# Patient Record
Sex: Male | Born: 1950 | Race: White | Hispanic: No
Health system: Southern US, Community
[De-identification: ages and names within clinical notes are randomized; demographics above are authoritative.]

---

## 2015-06-25 ENCOUNTER — Inpatient Hospital Stay (HOSPITAL_COMMUNITY): Payer: Commercial Managed Care - PPO

## 2015-06-25 ENCOUNTER — Inpatient Hospital Stay (HOSPITAL_COMMUNITY)
Admission: EM | Admit: 2015-06-25 | Discharge: 2015-07-27 | DRG: 208 | Disposition: E | Payer: Commercial Managed Care - PPO | Attending: Internal Medicine | Admitting: Internal Medicine

## 2015-06-25 ENCOUNTER — Encounter (HOSPITAL_COMMUNITY): Payer: Self-pay | Admitting: Emergency Medicine

## 2015-06-25 ENCOUNTER — Emergency Department (HOSPITAL_COMMUNITY): Payer: Commercial Managed Care - PPO

## 2015-06-25 DIAGNOSIS — I468 Cardiac arrest due to other underlying condition: Secondary | ICD-10-CM | POA: Diagnosis present

## 2015-06-25 DIAGNOSIS — R739 Hyperglycemia, unspecified: Secondary | ICD-10-CM | POA: Diagnosis present

## 2015-06-25 DIAGNOSIS — G931 Anoxic brain damage, not elsewhere classified: Secondary | ICD-10-CM | POA: Diagnosis present

## 2015-06-25 DIAGNOSIS — J690 Pneumonitis due to inhalation of food and vomit: Secondary | ICD-10-CM | POA: Diagnosis present

## 2015-06-25 DIAGNOSIS — I2699 Other pulmonary embolism without acute cor pulmonale: Principal | ICD-10-CM | POA: Diagnosis present

## 2015-06-25 DIAGNOSIS — R402212 Coma scale, best verbal response, none, at arrival to emergency department: Secondary | ICD-10-CM | POA: Diagnosis present

## 2015-06-25 DIAGNOSIS — R4020 Unspecified coma: Secondary | ICD-10-CM | POA: Diagnosis present

## 2015-06-25 DIAGNOSIS — E669 Obesity, unspecified: Secondary | ICD-10-CM | POA: Diagnosis present

## 2015-06-25 DIAGNOSIS — I469 Cardiac arrest, cause unspecified: Secondary | ICD-10-CM | POA: Diagnosis present

## 2015-06-25 DIAGNOSIS — Z515 Encounter for palliative care: Secondary | ICD-10-CM | POA: Insufficient documentation

## 2015-06-25 DIAGNOSIS — D751 Secondary polycythemia: Secondary | ICD-10-CM | POA: Diagnosis present

## 2015-06-25 DIAGNOSIS — N170 Acute kidney failure with tubular necrosis: Secondary | ICD-10-CM | POA: Diagnosis present

## 2015-06-25 DIAGNOSIS — I442 Atrioventricular block, complete: Secondary | ICD-10-CM | POA: Diagnosis present

## 2015-06-25 DIAGNOSIS — R001 Bradycardia, unspecified: Secondary | ICD-10-CM | POA: Diagnosis present

## 2015-06-25 DIAGNOSIS — I248 Other forms of acute ischemic heart disease: Secondary | ICD-10-CM | POA: Diagnosis present

## 2015-06-25 DIAGNOSIS — N289 Disorder of kidney and ureter, unspecified: Secondary | ICD-10-CM

## 2015-06-25 DIAGNOSIS — R092 Respiratory arrest: Secondary | ICD-10-CM | POA: Diagnosis present

## 2015-06-25 DIAGNOSIS — R402112 Coma scale, eyes open, never, at arrival to emergency department: Secondary | ICD-10-CM | POA: Diagnosis present

## 2015-06-25 DIAGNOSIS — R402312 Coma scale, best motor response, none, at arrival to emergency department: Secondary | ICD-10-CM | POA: Diagnosis present

## 2015-06-25 DIAGNOSIS — Z66 Do not resuscitate: Secondary | ICD-10-CM | POA: Diagnosis present

## 2015-06-25 DIAGNOSIS — J9621 Acute and chronic respiratory failure with hypoxia: Secondary | ICD-10-CM | POA: Diagnosis not present

## 2015-06-25 DIAGNOSIS — Z6831 Body mass index (BMI) 31.0-31.9, adult: Secondary | ICD-10-CM

## 2015-06-25 DIAGNOSIS — G40901 Epilepsy, unspecified, not intractable, with status epilepticus: Secondary | ICD-10-CM | POA: Diagnosis present

## 2015-06-25 DIAGNOSIS — E872 Acidosis: Secondary | ICD-10-CM | POA: Diagnosis present

## 2015-06-25 DIAGNOSIS — I4510 Unspecified right bundle-branch block: Secondary | ICD-10-CM | POA: Diagnosis present

## 2015-06-25 DIAGNOSIS — R579 Shock, unspecified: Secondary | ICD-10-CM | POA: Diagnosis present

## 2015-06-25 DIAGNOSIS — K7200 Acute and subacute hepatic failure without coma: Secondary | ICD-10-CM | POA: Diagnosis present

## 2015-06-25 DIAGNOSIS — R569 Unspecified convulsions: Secondary | ICD-10-CM | POA: Diagnosis not present

## 2015-06-25 DIAGNOSIS — J9601 Acute respiratory failure with hypoxia: Secondary | ICD-10-CM | POA: Diagnosis present

## 2015-06-25 DIAGNOSIS — Z452 Encounter for adjustment and management of vascular access device: Secondary | ICD-10-CM | POA: Insufficient documentation

## 2015-06-25 LAB — I-STAT CHEM 8, ED
BUN: 39 mg/dL — ABNORMAL HIGH (ref 6–20)
Calcium, Ion: 1.15 mmol/L (ref 1.13–1.30)
Chloride: 93 mmol/L — ABNORMAL LOW (ref 101–111)
Creatinine, Ser: 2.7 mg/dL — ABNORMAL HIGH (ref 0.61–1.24)
Glucose, Bld: 214 mg/dL — ABNORMAL HIGH (ref 65–99)
HEMATOCRIT: 55 % — AB (ref 39.0–52.0)
HEMOGLOBIN: 18.7 g/dL — AB (ref 13.0–17.0)
POTASSIUM: 4.9 mmol/L (ref 3.5–5.1)
SODIUM: 132 mmol/L — AB (ref 135–145)
TCO2: 28 mmol/L (ref 0–100)

## 2015-06-25 LAB — APTT
APTT: 32 s (ref 24–37)
APTT: 28 s (ref 24–37)

## 2015-06-25 LAB — COMPREHENSIVE METABOLIC PANEL
ALK PHOS: 120 U/L (ref 38–126)
ALT: 316 U/L — AB (ref 17–63)
ANION GAP: 11 (ref 5–15)
AST: 714 U/L — ABNORMAL HIGH (ref 15–41)
Albumin: 3.1 g/dL — ABNORMAL LOW (ref 3.5–5.0)
BUN: 29 mg/dL — ABNORMAL HIGH (ref 6–20)
CALCIUM: 8.9 mg/dL (ref 8.9–10.3)
CO2: 27 mmol/L (ref 22–32)
Chloride: 93 mmol/L — ABNORMAL LOW (ref 101–111)
Creatinine, Ser: 2.96 mg/dL — ABNORMAL HIGH (ref 0.61–1.24)
GFR, EST AFRICAN AMERICAN: 24 mL/min — AB (ref >60)
GFR, EST NON AFRICAN AMERICAN: 21 mL/min — AB (ref >60)
Glucose, Bld: 213 mg/dL — ABNORMAL HIGH (ref 65–99)
Potassium: 4.8 mmol/L (ref 3.5–5.1)
Sodium: 131 mmol/L — ABNORMAL LOW (ref 135–145)
TOTAL PROTEIN: 6.8 g/dL (ref 6.5–8.1)
Total Bilirubin: 1.2 mg/dL (ref 0.3–1.2)

## 2015-06-25 LAB — POCT I-STAT 3, ART BLOOD GAS (G3+)
ACID-BASE DEFICIT: 2 mmol/L (ref 0.0–2.0)
ACID-BASE EXCESS: 5 mmol/L — AB (ref 0.0–2.0)
BICARBONATE: 33.9 meq/L — AB (ref 20.0–24.0)
Bicarbonate: 23.3 mEq/L (ref 20.0–24.0)
O2 SAT: 95 %
O2 SAT: 99 %
PH ART: 7.369 (ref 7.350–7.450)
PO2 ART: 183 mmHg — AB (ref 80.0–100.0)
PO2 ART: 79 mmHg — AB (ref 80.0–100.0)
Patient temperature: 98.6
TCO2: 25 mmol/L (ref 0–100)
TCO2: 36 mmol/L (ref 0–100)
pCO2 arterial: 40.4 mmHg (ref 35.0–45.0)
pCO2 arterial: 69.6 mmHg (ref 35.0–45.0)
pH, Arterial: 7.296 — ABNORMAL LOW (ref 7.350–7.450)

## 2015-06-25 LAB — I-STAT ARTERIAL BLOOD GAS, ED
ACID-BASE DEFICIT: 1 mmol/L (ref 0.0–2.0)
BICARBONATE: 27.7 meq/L — AB (ref 20.0–24.0)
O2 SAT: 99 %
PO2 ART: 155 mmHg — AB (ref 80.0–100.0)
Patient temperature: 98.6
TCO2: 30 mmol/L (ref 0–100)
pCO2 arterial: 61.6 mmHg (ref 35.0–45.0)
pH, Arterial: 7.261 — ABNORMAL LOW (ref 7.350–7.450)

## 2015-06-25 LAB — URINALYSIS, ROUTINE W REFLEX MICROSCOPIC
BILIRUBIN URINE: NEGATIVE
GLUCOSE, UA: NEGATIVE mg/dL
Ketones, ur: NEGATIVE mg/dL
Leukocytes, UA: NEGATIVE
Nitrite: NEGATIVE
Protein, ur: >300 mg/dL — AB
SPECIFIC GRAVITY, URINE: 1.02 (ref 1.005–1.030)
pH: 6 (ref 5.0–8.0)

## 2015-06-25 LAB — CBC WITH DIFFERENTIAL/PLATELET
BASOS ABS: 0.1 10*3/uL (ref 0.0–0.1)
BASOS PCT: 1 %
EOS ABS: 0.1 10*3/uL (ref 0.0–0.7)
EOS PCT: 1 %
HCT: 51 % (ref 39.0–52.0)
Hemoglobin: 16.9 g/dL (ref 13.0–17.0)
Lymphocytes Relative: 15 %
Lymphs Abs: 1.5 10*3/uL (ref 0.7–4.0)
MCH: 31.2 pg (ref 26.0–34.0)
MCHC: 33.1 g/dL (ref 30.0–36.0)
MCV: 94.3 fL (ref 78.0–100.0)
MONO ABS: 0.8 10*3/uL (ref 0.1–1.0)
Monocytes Relative: 9 %
Neutro Abs: 7.2 10*3/uL (ref 1.7–7.7)
Neutrophils Relative %: 75 %
PLATELETS: 118 10*3/uL — AB (ref 150–400)
RBC: 5.41 MIL/uL (ref 4.22–5.81)
RDW: 15.3 % (ref 11.5–15.5)
WBC: 9.6 10*3/uL (ref 4.0–10.5)

## 2015-06-25 LAB — URINE MICROSCOPIC-ADD ON

## 2015-06-25 LAB — BASIC METABOLIC PANEL
Anion gap: 10 (ref 5–15)
BUN: 29 mg/dL — AB (ref 6–20)
CO2: 22 mmol/L (ref 22–32)
Calcium: 8.6 mg/dL — ABNORMAL LOW (ref 8.9–10.3)
Chloride: 98 mmol/L — ABNORMAL LOW (ref 101–111)
Creatinine, Ser: 2.36 mg/dL — ABNORMAL HIGH (ref 0.61–1.24)
GFR calc Af Amer: 32 mL/min — ABNORMAL LOW (ref >60)
GFR, EST NON AFRICAN AMERICAN: 27 mL/min — AB (ref >60)
GLUCOSE: 190 mg/dL — AB (ref 65–99)
POTASSIUM: 4.2 mmol/L (ref 3.5–5.1)
Sodium: 130 mmol/L — ABNORMAL LOW (ref 135–145)

## 2015-06-25 LAB — PHOSPHORUS: PHOSPHORUS: 6.1 mg/dL — AB (ref 2.5–4.6)

## 2015-06-25 LAB — I-STAT TROPONIN, ED: TROPONIN I, POC: 0.2 ng/mL — AB (ref 0.00–0.08)

## 2015-06-25 LAB — I-STAT CG4 LACTIC ACID, ED: LACTIC ACID, VENOUS: 4.35 mmol/L — AB (ref 0.5–2.0)

## 2015-06-25 LAB — MAGNESIUM: MAGNESIUM: 2.3 mg/dL (ref 1.7–2.4)

## 2015-06-25 LAB — BRAIN NATRIURETIC PEPTIDE: B NATRIURETIC PEPTIDE 5: 589.1 pg/mL — AB (ref 0.0–100.0)

## 2015-06-25 LAB — RAPID URINE DRUG SCREEN, HOSP PERFORMED
AMPHETAMINES: NOT DETECTED
BARBITURATES: NOT DETECTED
Benzodiazepines: NOT DETECTED
Cocaine: NOT DETECTED
Opiates: NOT DETECTED
TETRAHYDROCANNABINOL: NOT DETECTED

## 2015-06-25 LAB — TROPONIN I: TROPONIN I: 0.17 ng/mL — AB (ref <0.031)

## 2015-06-25 LAB — LACTIC ACID, PLASMA: Lactic Acid, Venous: 2.6 mmol/L (ref 0.5–2.0)

## 2015-06-25 LAB — CORTISOL: Cortisol, Plasma: 36.7 ug/dL

## 2015-06-25 LAB — PROTIME-INR
INR: 1.35 (ref 0.00–1.49)
PROTHROMBIN TIME: 16.8 s — AB (ref 11.6–15.2)

## 2015-06-25 LAB — PROCALCITONIN: PROCALCITONIN: 1.04 ng/mL

## 2015-06-25 LAB — D-DIMER, QUANTITATIVE (NOT AT ARMC): D DIMER QUANT: 19.35 ug{FEU}/mL — AB (ref 0.00–0.50)

## 2015-06-25 LAB — GLUCOSE, CAPILLARY: Glucose-Capillary: 168 mg/dL — ABNORMAL HIGH (ref 65–99)

## 2015-06-25 LAB — CBG MONITORING, ED: GLUCOSE-CAPILLARY: 212 mg/dL — AB (ref 65–99)

## 2015-06-25 LAB — TSH: TSH: 1.387 u[IU]/mL (ref 0.350–4.500)

## 2015-06-25 LAB — MRSA PCR SCREENING: MRSA by PCR: NEGATIVE

## 2015-06-25 MED ORDER — MIDAZOLAM HCL 5 MG/ML IJ SOLN: 2.0000 mg | INTRAMUSCULAR | Status: DC | PRN

## 2015-06-25 MED ORDER — SODIUM CHLORIDE 0.9 % IV SOLN
1.0000 mg/h | INTRAVENOUS | Status: DC
  Filled 2015-06-25: qty 10

## 2015-06-25 MED ORDER — DEXTROSE 5 % IV SOLN
0.0000 ug/min | INTRAVENOUS | Status: DC
  Filled 2015-06-25: qty 4

## 2015-06-25 MED ORDER — MIDAZOLAM HCL 2 MG/2ML IJ SOLN
2.0000 mg | INTRAMUSCULAR | Status: DC | PRN
  Administered 2015-06-25 – 2015-06-26 (×5): 2 mg via INTRAVENOUS
  Filled 2015-06-25 (×5): qty 2

## 2015-06-25 MED ORDER — SODIUM CHLORIDE 0.9 % IV SOLN
1.5000 g | Freq: Two times a day (BID) | INTRAVENOUS | Status: DC
  Administered 2015-06-25 – 2015-06-27 (×4): 1.5 g via INTRAVENOUS
  Filled 2015-06-25 (×4): qty 1.5

## 2015-06-25 MED ORDER — PANTOPRAZOLE SODIUM 40 MG IV SOLR
40.0000 mg | Freq: Every day | INTRAVENOUS | Status: DC
  Administered 2015-06-25 – 2015-06-27 (×3): 40 mg via INTRAVENOUS
  Filled 2015-06-25 (×3): qty 40

## 2015-06-25 MED ORDER — HEPARIN (PORCINE) IN NACL 100-0.45 UNIT/ML-% IJ SOLN
1250.0000 [IU]/h | INTRAMUSCULAR | Status: DC
  Administered 2015-06-25: 1000 [IU]/h via INTRAVENOUS
  Filled 2015-06-25: qty 250

## 2015-06-25 MED ORDER — PROPOFOL 1000 MG/100ML IV EMUL
5.0000 ug/kg/min | Freq: Once | INTRAVENOUS | Status: DC
  Filled 2015-06-25: qty 100

## 2015-06-25 MED ORDER — ETOMIDATE 2 MG/ML IV SOLN
INTRAVENOUS | Status: DC | PRN
  Administered 2015-06-25: 10 mg via INTRAVENOUS

## 2015-06-25 MED ORDER — HEPARIN SODIUM (PORCINE) 5000 UNIT/ML IJ SOLN: 5000.0000 [IU] | Freq: Three times a day (TID) | INTRAMUSCULAR | Status: DC

## 2015-06-25 MED ORDER — SODIUM CHLORIDE 0.9 % IV SOLN
250.0000 mL | Freq: Once | INTRAVENOUS | Status: DC
Start: 2015-06-25 — End: 2015-06-28

## 2015-06-25 MED ORDER — INSULIN ASPART 100 UNIT/ML ~~LOC~~ SOLN
0.0000 [IU] | SUBCUTANEOUS | Status: DC
  Administered 2015-06-25: 2 [IU] via SUBCUTANEOUS
  Administered 2015-06-26: 3 [IU] via SUBCUTANEOUS
  Administered 2015-06-26: 1 [IU] via SUBCUTANEOUS
  Administered 2015-06-26: 3 [IU] via SUBCUTANEOUS
  Administered 2015-06-26: 1 [IU] via SUBCUTANEOUS
  Administered 2015-06-26 (×2): 2 [IU] via SUBCUTANEOUS
  Administered 2015-06-27 (×3): 1 [IU] via SUBCUTANEOUS
  Administered 2015-06-27 – 2015-06-28 (×3): 2 [IU] via SUBCUTANEOUS
  Administered 2015-06-28: 1 [IU] via SUBCUTANEOUS

## 2015-06-25 MED ORDER — SODIUM CHLORIDE 0.9 % IV SOLN: 250.0000 mL | INTRAVENOUS | Status: DC | PRN

## 2015-06-25 MED ORDER — ROCURONIUM BROMIDE 50 MG/5ML IV SOLN
INTRAVENOUS | Status: DC | PRN
  Administered 2015-06-25: 100 mg via INTRAVENOUS

## 2015-06-25 MED ORDER — SODIUM CHLORIDE 0.9 % IV SOLN
25.0000 ug/h | INTRAVENOUS | Status: DC
  Filled 2015-06-25: qty 50

## 2015-06-25 MED ORDER — ISOPROTERENOL HCL 0.2 MG/ML IJ SOLN
2.0000 ug/min | INTRAVENOUS | Status: DC
  Administered 2015-06-25: 2 ug/min via INTRAVENOUS
  Administered 2015-06-26: 10 ug/min via INTRAVENOUS
  Administered 2015-06-26: 7 ug/min via INTRAVENOUS
  Administered 2015-06-26: 10 ug/min via INTRAVENOUS
  Administered 2015-06-26: 11 ug/min via INTRAVENOUS
  Filled 2015-06-25 (×9): qty 5

## 2015-06-25 MED ORDER — CISATRACURIUM BESYLATE (PF) 200 MG/20ML IV SOLN
1.0000 ug/kg/min | INTRAVENOUS | Status: DC
  Filled 2015-06-25: qty 20

## 2015-06-25 MED ORDER — ALTEPLASE (PULMONARY EMBOLISM) INFUSION
100.0000 mg | Freq: Once | INTRAVENOUS | Status: AC
  Administered 2015-06-25: 100 mg via INTRAVENOUS
  Filled 2015-06-25: qty 100

## 2015-06-25 MED ORDER — ATROPINE SULFATE 0.1 MG/ML IJ SOLN
INTRAMUSCULAR | Status: AC
  Filled 2015-06-25: qty 10

## 2015-06-25 MED ORDER — SODIUM CHLORIDE 0.9 % IV SOLN
INTRAVENOUS | Status: DC
  Administered 2015-06-26: via INTRAVENOUS

## 2015-06-25 NOTE — Progress Notes (Signed)
patietn arrived to unit approx 1815, intubated, triple lumen in place to right femoral with arterial line in place as well.  Pupils react to light, no other response.  No appearance of gag reflex. Medications brought up with ED nurse, nimbex, levophed, versed, fentanyl and TPA.  Only thing infusing, NS upon arrival.  Started fentanyl and versed for approximately 5 minutes.  Dr Bary Richardfienstein called to floor and spoke to me directly, advised to start TPA without head CT or scan, also stated to d/c all sedation to see what patients mentation will be.  Stopped versed and fentanyl, started TPA per dr Tyson Aliasfeinstein order. Patient is currently lying in bed, twitching intermittently.

## 2015-06-25 NOTE — Progress Notes (Signed)
ANTIBIOTIC CONSULT NOTE - INITIAL  Pharmacy Consult for Unasyn Indication: r/o aspiration pna  Patient Measurements: Height: 5\' 6"  (167.6 cm) Weight: 190 lb 7.6 oz (86.4 kg) IBW/kg (Calculated) : 63.8  Vital Signs: Temp: 99.7 F (37.6 C) (11/30 1900) Temp Source: Core (Comment) (11/30 1815) BP: 123/59 mmHg (11/30 1931) Pulse Rate: 51 (11/30 1931)  Labs:  Recent Labs  05/29/2015 1645 06/05/2015 1700  WBC  --  9.6  HGB 18.7* 16.9  PLT  --  118*  CREATININE 2.70* 2.96*  Estimated Crcl: 10-30 ML/MIN  Microbiology: cx data: 11/30 sputum: px  Anti infective's  Unasyn: 11/30 >>   Assessment: [age] yoM admitted with unwitnessed arrest likely of cardiac origin that is now s/p empiric TPA due to concern for PE. Unasyn for questionable aspiration event.  Goal of Therapy:  treatment of infection   Plan:  1. Unasyn 1.5 gm Q 12H based on present renal fxn 2. Following daily   Pollyann SamplesAndy Ayo Guarino, PharmD, BCPS 06/10/2015, 7:54 PM Pager: [phone]

## 2015-06-25 NOTE — ED Notes (Signed)
The patient was found down, unknow time down.  He was at [employer].  EMS arrived and he was in asystole.  They did two rounds of EPI and one mg of Atropine.  They got pulses back, gave one EPi lost pulses gave one more Epi for a total of 2.  The rythm was a wide complex rythm.  CPR was a between 6-10 minutes.

## 2015-06-25 NOTE — H&P (Signed)
PULMONARY / CRITICAL CARE MEDICINE   Name: Andre [name] MRN: [id number] DOB: 07-15-1951    ADMISSION DATE:  06/06/2015 CONSULTATION DATE:  06/07/2015  CHIEF COMPLAINT: Hyacinth Meeker, MD  HISTORY OF PRESENT ILLNESS:   64 yr old male limited history available. 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 Was unresponsive and found in [place]. UNwitnsssd arrest. ACL by EMS, 6 min , epi given. Did have a wide rhythm documented also once ( may have been his bundle). To [place] ed. Evaluated by cards. CHB concerns, brady. Upon my evaluation dropping hR from 40/s now into 30's. Called to admit. rosc is about 6 min . Unwitnessed. Some secretions noted, no vomit. Some myoclonus per ER staff.  PAST MEDICAL HISTORY :  He  has no past medical history on file. I have no history to obtain. Its unknown PAST SURGICAL HISTORY: He  has no past surgical history on file. Unknwon. unresponsive Allergies not on file unknown, unresponsive  No current facility-administered medications on file prior to encounter.   No current outpatient prescriptions on file prior to encounter.   Meds: unresponsive, no one to give history FAMILY HISTORY:  His has no family status information on file.  Unknown, unresposnsive on vent SOCIAL HISTORY: He   unknwon  REVIEW OF SYSTEMS:   Unable, vented coded  SUBJECTIVE: about to code, bradying down intermittent  VITAL SIGNS: BP 128/66 mmHg  Pulse 49  Resp 14  Wt 113.399 kg (250 lb)  SpO2 98%  HEMODYNAMICS:    VENTILATOR SETTINGS: Vent Mode:  [-] PRVC FiO2 (%):  [100 %] 100 % Set Rate:  [14 bmp] 14 bmp Vt Set:  [550 mL] 550 mL PEEP:  [5 cmH20] 5 cmH20 Plateau Pressure:  [27 cmH20] 27 cmH20  INTAKE / OUTPUT:    PHYSICAL EXAMINATION: General:  Unresponsive to pain stimuli Neuro:  Per sluggish 2 mm, no movement to pain, no gag, no cough HEENT:  jvd noted, collar Cardiovascular:  s1 s2 distant,  brady Lungs:  ronchi bilat Abdomen:  Soft, BS hypo, nt, nd, no brusing Musculoskeletal:  No hematoma, no bruising Skin:  No rash  LABS:  CBC  Recent Labs Lab 06/16/2015 1645  HGB 18.7*  HCT 55.0*   Coag's No results for input(s): APTT, INR in the last 168 hours. BMET  Recent Labs Lab 06/09/2015 1645  NA 132*  K 4.9  CL 93*  BUN 39*  CREATININE 2.70*  GLUCOSE 214*   Electrolytes No results for input(s): CALCIUM, MG, PHOS in the last 168 hours. Sepsis Markers  Recent Labs Lab 06/11/2015 1646  LATICACIDVEN 4.35*   ABG No results for input(s): PHART, PCO2ART, PO2ART in the last 168 hours. Liver Enzymes No results for input(s): AST, ALT, ALKPHOS, BILITOT, ALBUMIN in the last 168 hours. Cardiac Enzymes No results for input(s): TROPONINI, PROBNP in the last 168 hours. Glucose  Recent Labs Lab 06/16/2015 1633  GLUCAP 212*    Imaging Dg Chest Portable 1 View  06/10/2015  CLINICAL DATA:  Post CPR. EXAM: PORTABLE CHEST 1 VIEW COMPARISON:  Chest x-ray dated 02/19/2015. FINDINGS: The endotracheal tube tip is slightly into the right mainstem bronchus. Cardiomediastinal silhouette is stable in size given the supine patient positioning. Patchy airspace opacities are seen bilaterally, upper lobe predominant, most likely edema. No pleural effusions seen. No pneumothorax seen. No osseous fracture or dislocation seen. Pacer pads overlie the left heart shadow. Enteric tube passes below the diaphragm. IMPRESSION: 1. Endotracheal  tube tip slightly into the right mainstem bronchus. Recommend retracting 2-3 cm. 2. Patchy airspace opacities bilaterally, most likely edema. These results were called by telephone at the time of interpretation on 01-22-2015 at 4:54 pm to Dr. Eber HongBRIAN MILLER , who verbally acknowledged these results. Electronically Signed   By: Bary RichardStan  Maynard M.D.   On: 01-22-2015 16:55     STUDIES:  Echo 11/30 LImited echo>>>poor rv and lf fxn, rv dilated and hypokinetic, no sig  effusion, poor images at times on vent  CULTURES: Sputum 11/30>>>  ANTIBIOTICS: unasyn 11/30>>>  SIGNIFICANT EVENTS: 11/30 unwitnessed arrest, brady, HB in ed  LINES/TUBES: 11/30 rt fem a line>>> 11/30 rt fem cvl>>>   ASSESSMENT / PLAN:  PULMONARY A: Acute resp failure, r/o PE, uncompendated acidosis P:   ABG STAT reviewed, increase rr, repeat abg Reduce O2 to 60% See cvs Unable to ct chest for PE< arf  CARDIOVASCULAR A:  S/p Arrest, RBBB new?, r/o PE, Heart Block P:  Doppler legs asap, echo i did limited at bedside as did cards, concern for rv failure, r/o cariomyoapthy I am concerned he will code again soon, consider empiric tpa stat, if re arrest push tnk or alteplace Cards following No trauma history , associated apparent LV dysfn and RV fxn, concerns or primary cardiac event, PE and NOT ICH as cause, no time for head CT as may re arrest and not be monitored closely in CT scanner - proceed empiric TPA, we have limited options and may re arrest Get offical echo as able  RENAL A:   ARF, unknown baseline  P:   Consider US if survives night Lactic noted post arrest bmet q8h  GASTROINTESTINAL A:   Likely shock liver P:   LFT in am  NPO ppi  HEMATOLOGIC A:   Polycythemia (r/o hypovolemia, chronic hypoxia?), r/o dvt, PE P:  Cbc in am  TPA see above stat  INFECTIOUS A:   Concern aspiration event P:   unasyn sputum  ENDOCRINE A:   Hyperglycemia, r/o DM P:   ssi Get tsh Get cortisol  NEUROLOGIC A:   S/p arrest, [place] significant anoxia, NO evidence trauma P:   RASS goal: 0 Dc nimbex, versed, fent, prop Too unstable to scan, see above  eeg Not a candidate temp management, avoid fevers as able   FAMILY  - Updates: I looked for family. NON present  - Inter-disciplinary family meet or Palliative Care meeting due by:  12/7  Ccm time 90 min  Mcarthur Rossettianiel J. Tyson AliasFeinstein, MD, FACP Pgr: [phone] [facility]  Pulmonary and  Critical Care Medicine [facility] Pager: [phone]  01-22-2015, 5:04 PM

## 2015-06-25 NOTE — Consult Note (Signed)
Patient ID: Andre [name] MRN: [id number], DOB/AGE: January 06, 1951   Admit date: July 15, 2015   Primary Physician: No primary care provider on file. Primary Cardiologist: New   Andre [name] is a 64 y.o. male with unknown past medical history who presented to [facility] ED on 2015/07/15 following a cardiac arrest.   The patient was noticed to be down at work by coworkers. Upon the arrival of EMS, he was noted to be in asystole. CPR was started and he was administered Epinephrine x2 with return of pulses. He then went into asystole again and Epinephrine was administered x1 with return to sinus bradycardia.   Upon arrival to the ED, his Andre [name] airway was removed and he was intubated. His EKG showed possible junctional rhythm with a RBBB. Unsure if this is new or old since there are no previous EKG tracings to compare this to. His BP is stable at 128/66 while in the ED. HR is in the 40's - 50's.  His only known history is a coworker mentioning "he was hospitalized a few weeks ago for congestive heart failure". Unfortunately, no medical history is available in Care Everywhere.   Home Medications Prior to Admission medications   Not on File    Allergies - Unable to be obtained secondary to patient's sedation and intubation.  Allergies not on file  Past Medical History - Unable to be obtained secondary to patient's sedation and intubation.  No past medical history on file.   Surgical History -  Unable to be obtained secondary to patient's sedation and intubation.   No past surgical history on file.   Family History -  Unable to be obtained secondary to patient's sedation and intubation.  No family history on file.  Social History - Unable to be obtained secondary to patient's sedation and intubation.   Social History   Social History  . Marital Status: Unknown    Spouse Name: N/A  . Number of Children: N/A  . Years of Education: N/A   Occupational History  . Not on file.    Social History Main Topics  . Smoking status: Not on file  . Smokeless tobacco: Not on file  . Alcohol Use: Not on file  . Drug Use: Not on file  . Sexual Activity: Not on file   Other Topics Concern  . Not on file   Social History Narrative  . No narrative on file     Review of Systems - Unable to be obtained secondary to sedation and currently intubated.   Physical Exam: Blood pressure 128/66, pulse 49, resp. rate 14, weight 250 lb (113.399 kg), SpO2 98 %.  General: Obese Caucasian,male. Currently intubated. Head: Normocephalic, atraumatic, sclera non-icteric, no xanthomas, nares are without discharge. Pupils reactive. Neck: No carotid bruits. JVD not elevated.  Lungs: Respirations regular and unlabored, currently intubated.  Heart: Regular rate and rhythm. No S3 or S4.  No murmur, no rubs, or gallops appreciated. Abdomen: Soft, non-tender, non-distended with normoactive bowel sounds. No hepatomegaly. No rebound/guarding. No obvious abdominal masses. Msk:  Strength and tone appear normal for age. No joint deformities or effusions. Extremities: No clubbing or cyanosis. No edema.  Distal pedal pulses are 2+ bilaterally. Ice packs placed along patient's body. Neuro: Alert and oriented X 3. Moves all extremities spontaneously. No focal deficits noted. Psych:  Responds to questions appropriately with a normal affect. Skin: No rashes or lesions noted   Labs   Lab Results  Component Value Date   HGB 18.7*  06/15/2015   HCT 55.0* 06/04/2015     Recent Labs Lab 06/17/2015 1645  NA 132*  K 4.9  CL 93*  BUN 39*  CREATININE 2.70*  GLUCOSE 214*   Troponin (Point of Care Test) No results for input(s): TROPIPOC in the last 72 hours. No results for input(s): CKTOTAL, CKMB, TROPONINI in the last 72 hours. No results for input(s): INR in the last 72 hours. No results found for: CHOL, HDL, LDLCALC, TRIG No results found for: BNP No results found for: PROBNP No results found  for: DDIMER   ECG: Junctional rhythm with RBBB.  Echo: None on File.  Radiology/Studies: Dg Chest Portable 1 View: 06/01/2015  CLINICAL DATA:  Post CPR. EXAM: PORTABLE CHEST 1 VIEW COMPARISON:  Chest x-ray dated 02/19/2015. FINDINGS: The endotracheal tube tip is slightly into the right mainstem bronchus. Cardiomediastinal silhouette is stable in size given the supine patient positioning. Patchy airspace opacities are seen bilaterally, upper lobe predominant, most likely edema. No pleural effusions seen. No pneumothorax seen. No osseous fracture or dislocation seen. Pacer pads overlie the left heart shadow. Enteric tube passes below the diaphragm. IMPRESSION: 1. Endotracheal tube tip slightly into the right mainstem bronchus. Recommend retracting 2-3 cm. 2. Patchy airspace opacities bilaterally, most likely edema. These results were called by telephone at the time of interpretation on 06/02/2015 at 4:54 pm to Dr. Eber HongBRIAN [name] , who verbally acknowledged these results. Electronically Signed   By: Andre RichardStan  Maynard M.D.   On: 06/16/2015 16:55   ASSESSMENT AND PLAN:  1. Cardiac arrest [facility](HCC) - had unknown down time. Found to be in asystole. Received Epinephrine x3 while [facility] route [facility]. - differential to include Pulmonary Embolism vs. Myocardial Infarction vs. Arrhythmia.  - STAT CTA and Head CT is pending. - Will cycle cardiac enzymes - Obtain echocardiogram  Signed, Andre LennoxBrittany M Strader, PA-C 06/01/2015, 5:10 PM Pager: [phone]  Patient seen and examined and history reviewed. Agree with above findings and plan. 64 yo WM with unknown history brought in by EMS s/p cardiac arrest at work. Down time is unknown. History is unknown. Apparently co-worker states he was admitted recently with heart failure. No data available in Epic or Care-everywhere. Initial rhythm is asystole. Responded to CPR and multiple boluses of IV epinephrine. Now in complete heart block with wide escape rhythm of 50 bpm.  Patient intubated and unresponsive. Exam with very coarse BS. Heart sounds inaudible. Ecg shows NSR with complete heart block and wide RBBB escape rhythm. Plts 118k. Creatinine 2.7. Hgb normal. Lactate elevated 4.35. CXR shows patchy airspace disease. Bedside portable Echo shows RV enlargement with hypokinesis. The LV function is down globally.  The deferential includes acute PE versus ACS. He does not have ST elevation. With unknown down time and initial rhythm of asystole his prognosis is very poor. Discussed with Dr. Tyson AliasFeinstein and we agree that IV lytic therapy with TPA is appropriate. Will cycle cardiac enzymes and Ecg. I suspect CHB is secondary to major circulatory event. If he has progressive bradycardia could consider temp pacing but currently rate is stable.   Willem Klingensmith [place], MDFACC 06/21/2015 5:35 PM

## 2015-06-25 NOTE — Progress Notes (Addendum)
ANTICOAGULATION CONSULT NOTE - Initial Consult  Pharmacy Consult for Heparin Indication: PE s/p tPA  Not on File  Patient Measurements: Height: 5\' 6"  (167.6 cm) Weight: 190 lb 7.6 oz (86.4 kg) IBW/kg (Calculated) : 63.8 Heparin Dosing Weight: 80 kg  Vital Signs: Temp: 100.4 F (38 C) (11/30 2130) Temp Source: Core (Comment) (11/30 2000) BP: 123/64 mmHg (11/30 2100) Pulse Rate: 42 (11/30 2130)  Labs:  Recent Labs  10-24-14 1645 10-24-14 1700 10-24-14 1902 10-24-14 2020 10-24-14 2222  HGB 18.7* 16.9  --   --   --   HCT 55.0* 51.0  --   --   --   PLT  --  118*  --   --   --   APTT  --   --  32  --  28  LABPROT  --   --  16.8*  --   --   INR  --   --  1.35  --   --   CREATININE 2.70* 2.96*  --  2.36*  --   TROPONINI  --  0.17*  --   --   --     Estimated Creatinine Clearance: 32.6 mL/min (by C-G formula based on Cr of 2.36).   Assessment: 64 y.o. male with cardiac arrest, likely PE s/p tPA, to start heparin  Goal of Therapy:  Heparin level 0.3-0.5 Monitor platelets by anticoagulation protocol: Yes   Plan:  Start heparin 1150 units/hr Check heparin level in 8 hours.  Zavion Sleight, Gary FleetGregory Vernon 2015/03/16,10:57 PM  Addendum: First level 0.15  Increase Heparin 1250 units/hr Check heparin level in 8 hours.  Geannie RisenGreg Teigen Bellin, PharmD, BCPS 06/26/2015 .7:40 AM

## 2015-06-25 NOTE — Progress Notes (Signed)
eLink Physician-Brief Progress Note Patient Name: Andre [name] DOB: 11/07/1950 MRN: [id number]   Date of Service  05/31/2015  HPI/Events of Note  Bedside nurse states that patient is in complete heart block.  eICU Interventions  Will order: 1. Place external pacer.  2. Notify cardiology of complete heart block.     Intervention Category Major Interventions: Arrhythmia - evaluation and management  Sommer,Steven Eugene 06/15/2015, 10:01 PM

## 2015-06-25 NOTE — ED Provider Notes (Signed)
CSN: [id number]     Arrival date & time 05/28/2015  1612 History   First MD Initiated Contact with Patient 06/07/2015 1629     Chief Complaint  Patient presents with  . Cardiac Arrest    The patient was found down, unknow time down.  He was at [employer].  EMS arrived and he was in asystole.  They did two rounds of EPI and one mg of Atropine.  They got pulses back, gave one EPi lost pulses gave one more Epi for a total of 2.  The rythm was a wide complex rythm.  CPR was a between 6-10 minutes.     (Consider location/radiation/quality/duration/timing/severity/associated sxs/prior Treatment) HPI  Patient is a 64 year old male with unknown past medical history who presents to the emergency department post cardiac arrest. Patient was at work when he was found down by coworkers. Unknown down time. When EMS arrived patient was unresponsive, without a pulse. Initial rhythm asystole. CPR was initiated. Received 1 round of epinephrine, regained pulses, then lost pulses again.  Received another round of CPR with a dose of epi and atropine for bradycardia and achieved ROSC, continued to have pulses upon arrival to the ED.  King airway in place on arrival, mechanically ventilated. Per report coworkers stated that he had recently been hospitalized for CHF. Coworkers uncertain of any other medical history.  History reviewed. No pertinent past medical history. History reviewed. No pertinent past surgical history. History reviewed. No pertinent family history. Social History  Substance Use Topics  . Smoking status: None  . Smokeless tobacco: None  . Alcohol Use: None     Comment: unknown    Review of Systems  Unable to perform ROS: Patient unresponsive      Allergies  Review of patient's allergies indicates not on file.  Home Medications   Prior to Admission medications   Medication Sig Start Date End Date Taking? Authorizing Provider  amiodarone (PACERONE) 200 MG tablet Take 200 mg  by mouth 2 (two) times daily. 06/05/15   Historical Provider, MD  carvedilol (COREG) 3.125 MG tablet Take 3.125 mg by mouth 2 (two) times daily. 06/24/15   Historical Provider, MD  furosemide (LASIX) 40 MG tablet Take 40 mg by mouth 2 (two) times daily. 06/12/15   Historical Provider, MD  lansoprazole (PREVACID) 30 MG capsule Take 30 mg by mouth daily. 06/05/15   Historical Provider, MD  theophylline (THEODUR) 300 MG 12 hr tablet Take 300 mg by mouth 2 (two) times daily. 06/24/15   Historical Provider, MD   BP 123/64 mmHg  Pulse 64  Temp(Src) 104 F (40 C) (Core (Comment))  Resp 25  Ht 5\' 6"  (1.676 m)  Wt 86.4 kg  BMI 30.76 kg/m2  SpO2 100% Physical Exam  Constitutional: He appears well-developed and well-nourished.  HENT:  Head: Normocephalic and atraumatic.  Mouth/Throat: Oropharynx is clear and moist.  Eyes: Conjunctivae and EOM are normal.  2 mm equal, sluggish  Neck: Normal range of motion. No JVD present. No tracheal deviation present.  Cervical collar placed  Cardiovascular: Regular rhythm and intact distal pulses.   Bradycardic  Pulmonary/Chest: No respiratory distress.  Intubated with 8.0 ETT, 25 at the lips. Coarse breath sounds bilaterally  Abdominal: Soft. Bowel sounds are normal. He exhibits no distension. There is no tenderness. There is no rebound.  Genitourinary: Penis normal.  Musculoskeletal: He exhibits no edema.  Neurological: He is unresponsive. GCS eye subscore is 1. GCS verbal subscore is 1. GCS motor subscore is 1.  Skin: Skin is warm.  Nursing note and vitals reviewed.   ED Course  Procedures (including critical care time) Labs Review Labs Reviewed  COMPREHENSIVE METABOLIC PANEL - Abnormal; Notable for the following:    Sodium 131 (*)    Chloride 93 (*)    Glucose, Bld 213 (*)    BUN 29 (*)    Creatinine, Ser 2.96 (*)    Albumin 3.1 (*)    AST 714 (*)    ALT 316 (*)    GFR calc non Af Amer 21 (*)    GFR calc Af Amer 24 (*)    All other  components within normal limits  BRAIN NATRIURETIC PEPTIDE - Abnormal; Notable for the following:    B Natriuretic Peptide 589.1 (*)    All other components within normal limits  TROPONIN I - Abnormal; Notable for the following:    Troponin I 0.17 (*)    All other components within normal limits  CBC WITH DIFFERENTIAL/PLATELET - Abnormal; Notable for the following:    Platelets 118 (*)    All other components within normal limits  PHOSPHORUS - Abnormal; Notable for the following:    Phosphorus 6.1 (*)    All other components within normal limits  TROPONIN I - Abnormal; Notable for the following:    Troponin I 0.27 (*)    All other components within normal limits  LACTIC ACID, PLASMA - Abnormal; Notable for the following:    Lactic Acid, Venous 2.6 (*)    All other components within normal limits  URINALYSIS, ROUTINE W REFLEX MICROSCOPIC (NOT AT [facility]) - Abnormal; Notable for the following:    APPearance CLOUDY (*)    Hgb urine dipstick LARGE (*)    Protein, ur >300 (*)    All other components within normal limits  BLOOD GAS, ARTERIAL - Abnormal; Notable for the following:    pH, Arterial 7.308 (*)    pCO2 arterial 49.8 (*)    pO2, Arterial 248 (*)    All other components within normal limits  D-DIMER, QUANTITATIVE (NOT AT [facility]) - Abnormal; Notable for the following:    D-Dimer, Quant 19.35 (*)    All other components within normal limits  PROTIME-INR - Abnormal; Notable for the following:    Prothrombin Time 16.8 (*)    All other components within normal limits  URINE MICROSCOPIC-ADD ON - Abnormal; Notable for the following:    Squamous Epithelial / LPF 0-5 (*)    Bacteria, UA FEW (*)    Casts GRANULAR CAST (*)    Crystals CA OXALATE CRYSTALS (*)    All other components within normal limits  BASIC METABOLIC PANEL - Abnormal; Notable for the following:    Sodium 130 (*)    Chloride 98 (*)    Glucose, Bld 190 (*)    BUN 29 (*)    Creatinine, Ser 2.36 (*)    Calcium 8.6 (*)     GFR calc non Af Amer 27 (*)    GFR calc Af Amer 32 (*)    All other components within normal limits  GLUCOSE, CAPILLARY - Abnormal; Notable for the following:    Glucose-Capillary 168 (*)    All other components within normal limits  GLUCOSE, CAPILLARY - Abnormal; Notable for the following:    Glucose-Capillary 148 (*)    All other components within normal limits  CBG MONITORING, ED - Abnormal; Notable for the following:    Glucose-Capillary 212 (*)    All other components within  normal limits  I-STAT CHEM 8, ED - Abnormal; Notable for the following:    Sodium 132 (*)    Chloride 93 (*)    BUN 39 (*)    Creatinine, Ser 2.70 (*)    Glucose, Bld 214 (*)    Hemoglobin 18.7 (*)    HCT 55.0 (*)    All other components within normal limits  I-STAT CG4 LACTIC ACID, ED - Abnormal; Notable for the following:    Lactic Acid, Venous 4.35 (*)    All other components within normal limits  I-STAT TROPOININ, ED - Abnormal; Notable for the following:    Troponin i, poc 0.20 (*)    All other components within normal limits  I-STAT ARTERIAL BLOOD GAS, ED - Abnormal; Notable for the following:    pH, Arterial 7.261 (*)    pCO2 arterial 61.6 (*)    pO2, Arterial 155.0 (*)    Bicarbonate 27.7 (*)    All other components within normal limits  POCT I-STAT 3, ART BLOOD GAS (G3+) - Abnormal; Notable for the following:    pO2, Arterial 79.0 (*)    All other components within normal limits  POCT I-STAT 3, ART BLOOD GAS (G3+) - Abnormal; Notable for the following:    pH, Arterial 7.296 (*)    pCO2 arterial 69.6 (*)    pO2, Arterial 183.0 (*)    Bicarbonate 33.9 (*)    Acid-Base Excess 5.0 (*)    All other components within normal limits  MRSA PCR SCREENING  CULTURE, BLOOD (ROUTINE X 2)  CULTURE, BLOOD (ROUTINE X 2)  URINE CULTURE  CULTURE, RESPIRATORY (NON-EXPECTORATED)  MAGNESIUM  PROCALCITONIN  URINE RAPID DRUG SCREEN, HOSP PERFORMED  APTT  APTT  TSH  CORTISOL  TROPONIN I  CBC   BASIC METABOLIC PANEL  MAGNESIUM  PHOSPHORUS  BASIC METABOLIC PANEL  HEPARIN LEVEL (UNFRACTIONATED)  BASIC METABOLIC PANEL  BASIC METABOLIC PANEL    Imaging Review Dg Chest Port 1 View  06/18/2015  CLINICAL DATA:  Central line placement. Cardiac arrest. On ventilator. EXAM: PORTABLE CHEST 1 VIEW COMPARISON:  Prior today FINDINGS: Endotracheal tube is been pulled back with tip now approximately 4.5 cm above carina. Nasogastric tube remains in place with tip in distal stomach. Airspace opacity is seen in the peripheral right upper lobe which is increased since previous study. Left lung remains clear. Heart size is stable. No pneumothorax visualized. IMPRESSION: Endotracheal tube in appropriate position. Increased airspace opacity in peripheral right upper lobe. Electronically Signed   By: Myles Rosenthal M.D.   On: 06/06/2015 20:09   Dg Chest Portable 1 View  06/02/2015  CLINICAL DATA:  Post CPR. EXAM: PORTABLE CHEST 1 VIEW COMPARISON:  Chest x-ray dated 02/19/2015. FINDINGS: The endotracheal tube tip is slightly into the right mainstem bronchus. Cardiomediastinal silhouette is stable in size given the supine patient positioning. Patchy airspace opacities are seen bilaterally, upper lobe predominant, most likely edema. No pleural effusions seen. No pneumothorax seen. No osseous fracture or dislocation seen. Pacer pads overlie the left heart shadow. Enteric tube passes below the diaphragm. IMPRESSION: 1. Endotracheal tube tip slightly into the right mainstem bronchus. Recommend retracting 2-3 cm. 2. Patchy airspace opacities bilaterally, most likely edema. These results were called by telephone at the time of interpretation on 06/12/2015 at 4:54 pm to Dr. Eber Hong , who verbally acknowledged these results. Electronically Signed   By: Bary Richard M.D.   On: 05/31/2015 16:55   I have personally reviewed and evaluated these  images and lab results as part of my medical decision-making.   EKG  Interpretation   Date/Time:  Wednesday June 25 2015 16:45:12 EST Ventricular Rate:  50 PR Interval:  178 QRS Duration: 207 QT Interval:  574 QTC Calculation: 523 R Axis:   -88 Text Interpretation:  Potential heart block LAE, consider biatrial  enlargement RBBB and LAFB LVH by voltage Abnormal T, consider ischemia,  lateral leads Abnormal ekg Confirmed by Hyacinth MeekerMILLER  MD, BRIAN ([id number]) on  04/23/15 5:23:50 PM      MDM   Final diagnoses:  RBBB  Cardiac arrest (HCC)  Respiratory arrest [facility]HCC)    Patient is a 64 year old male with unknown past medical history who presents to the emergency department post cardiac arrest. Unknown down time. Asystole initial rhythm. ROSC after 2 rounds of epi, CPR for 5 mins,atropine for bradycardia. Unresponsive since that time.  On arrival, intact distal pulses, BP 106/51, HR 50s. GCS 3T. Exam as above, notable for no traumatic injuries noted, pupils 2 mm and reactive, coarse breath sounds bilaterally, soft abdomen, possible myoclonic jerks.   C collar placed on arrival for unknown mechanism of injury. Intubated for respiratory failure, see procedure note for details. EKG showed complete heart block with 2:1 conduction, bradycardic, RBBB, no signs of ischemia, no prior EKG to compare.   Cardiology immediately consulted to evaluate the patient, evaluated the patient in the emergency department. Recommended cooling the patient. Ice packs placed in axilla and groin, started on cold IVF. Critical care also consulted, evaluated the patient in the emergency department. Bedside echo concerning for enlarged RV with hypokinesis and decreased EF of LV.    CXR showed pulmonary edema vs aspiration. Glu 212. LA 4.3. Cr 2.7, no significant electrolyte abnormalities. Troponin 0.20. Hgb 18.7. No leukocytosis. After cooling the patient for a short period, recommended no longer cooling the patient. Concerns for PE vs ACS given the patient's clinical picture and bedside echo.   Critical care team recommended tPA, tPA given. Central line and a line placed by critical care. Patient transported to the ICU for further management. Transferred in stable condition.        Corena HerterShannon Shanta Dorvil, MD 06/26/15 0430  Eber HongBrian Miller, MD 06/27/15 [phone]

## 2015-06-25 NOTE — Procedures (Signed)
Limited echo  1. Reduced LV fxn 2. Reduced rv fxn, hypokinetic moderate to severe 3. Dilated rv moderate 4. No sig pericardial effusion 5. LVH  Mcarthur Rossettianiel J. Tyson AliasFeinstein, MD, FACP Pgr: [phone] [place]

## 2015-06-25 NOTE — Progress Notes (Signed)
Preliminary results by tech - Lower Ext. Venous Duplex Completed. Negative for deep and superficial vein thrombosis in both legs. Kate Larock, BS, RDMS, RVT  

## 2015-06-25 NOTE — Procedures (Signed)
Central Venous Catheter Insertion Procedure Note Andre [name] [id number] 10/01/1950  Procedure: Insertion of Central Venous Catheter Indications: Assessment of intravascular volume, Drug and/or fluid administration and Frequent blood sampling  Procedure Details Consent: Unable to obtain consent because of emergent medical necessity. Time Out: Verified patient identification, verified procedure, site/side was marked, verified correct patient position, special equipment/implants available, medications/allergies/relevent history reviewed, required imaging and test results available.  Performed  Maximum sterile technique was used including antiseptics, cap, gloves, gown, hand hygiene, mask and sheet. Skin prep: Chlorhexidine; local anesthetic administered A antimicrobial bonded/coated triple lumen catheter was placed in the right femoral vein due to emergent situation using the Seldinger technique.  Evaluation Blood flow good Complications: No apparent complications Patient did tolerate procedure well. Chest X-ray ordered to verify placement.  CXR: normal.  Nelda BucksFEINSTEIN,DANIEL J. 17-Jun-2015, 5:40 PM  Koreas colllar Mcarthur Rossettianiel J. Tyson AliasFeinstein, MD, FACP Pgr: [phone] [place]

## 2015-06-25 NOTE — Progress Notes (Signed)
eLink Physician-Brief Progress Note Patient Name: Andre CornerJimmy Capell DOB: October 15, 1950 MRN: [id number]   Date of Service  2015-06-21  HPI/Events of Note  Needs CXR s/p central line placement.  eICU Interventions  Will order portable CXR now.     Intervention Category Minor Interventions: Clinical assessment - ordering diagnostic tests  Lenell AntuSommer,Babatunde Seago Eugene 2015-06-21, 7:38 PM

## 2015-06-25 NOTE — Procedures (Signed)
Arterial Catheter Insertion Procedure Note Michaela CornerJimmy Plagge [id number] Aug 07, 1950  Procedure: Insertion of Arterial Catheter  Indications: Blood pressure monitoring and Frequent blood sampling  Procedure Details Consent: Unable to obtain consent because of emergent medical necessity. Time Out: Verified patient identification, verified procedure, site/side was marked, verified correct patient position, special equipment/implants available, medications/allergies/relevent history reviewed, required imaging and test results available.  Performed  Maximum sterile technique was used including antiseptics, cap, gloves, gown, hand hygiene, mask and sheet. Skin prep: Chlorhexidine; local anesthetic administered 20 gauge catheter was inserted into right femoral artery using the Seldinger technique.  Evaluation Blood flow good; BP tracing good. Complications: No apparent complications.   Nelda BucksFEINSTEIN,DANIEL J. 08-07-2014  US  Mcarthur Rossettianiel J. Tyson AliasFeinstein, MD, FACP Pgr: [phone] [facility]

## 2015-06-25 NOTE — ED Provider Notes (Signed)
The patient is a 64 year old male, he presents to the hospital incardiac arrest after according to paramedics he had a unwitnessed fall and arrest  at his place of work, evidently the patient was on the ground, he was in asystole, he was unresponsive and has been unresponsive since that time. He was initially given 2 doses of epinephrine after which time he regained pulses, he then lost them a short time later, received a short bout of CPR, one more dose of epinephrine and a dose of atropine for his bradycardia and he continued to have pulses until the hospital. He had a King airway placed in the field, he was ventilated mechanically as he had no respiratory drive initially - gradually had some spontaeous respirations.  On arrival the patient has strong pulses, he is bradycardic,  Intubated for respiratory failure - ECG shows complete heart block - cardiolgy paged and participating in care immediately - ICU team paged - Dr. Vaughan BastaSummer aware and will have someone come to evaluate pt for ICU - requested cooling.  Called back later and asked to not cool.    Dr. SwazilandJordan with Cardiology and Dr. Tyson AliasFeinstein with ICU have seen and evaluated - CC to place central line and A line - they have decided to give TPA incase this is a PE (poor LV and dilated RV on bedside US).  Dr. Tyson AliasFeinstein has assumed care and will transition pt to the ICU.  I saw and evaluated the patient, reviewed the resident's note and I agree with the findings and plan.   EKG Interpretation  Date/Time:  Wednesday June 25 2015 16:45:12 EST Ventricular Rate:  50 PR Interval:  178 QRS Duration: 207 QT Interval:  574 QTC Calculation: 523 R Axis:   -88 Text Interpretation:  Potential heart block LAE, consider biatrial enlargement RBBB and LAFB LVH by voltage Abnormal T, consider ischemia, lateral leads Abnormal ekg Confirmed by Ari Engelbrecht  MD, Dorella Laster ([id number]) on 06/16/2015 5:23:50 PM      CRITICAL CARE Performed by: Eber HongMILLER,Paiden Caraveo D Total critical  care time: 35 minutes Critical care time was exclusive of separately billable procedures and treating other patients. Critical care was necessary to treat or prevent imminent or life-threatening deterioration. Critical care was time spent personally by me on the following activities: development of treatment plan with patient and/or surrogate as well as nursing, discussions with consultants, evaluation of patient's response to treatment, examination of patient, obtaining history from patient or surrogate, ordering and performing treatments and interventions, ordering and review of laboratory studies, ordering and review of radiographic studies, pulse oximetry and re-evaluation of patient's condition.  I was personally present and directly supervised the following procedures:  INTUBATION   Final diagnoses:  RBBB  Cardiac arrest [facility](HCC)  Respiratory arrest (HCC)        Eber HongBrian Daine Gunther, MD 06/27/15 1328

## 2015-06-26 ENCOUNTER — Inpatient Hospital Stay (HOSPITAL_COMMUNITY): Payer: Commercial Managed Care - PPO

## 2015-06-26 DIAGNOSIS — R569 Unspecified convulsions: Secondary | ICD-10-CM

## 2015-06-26 DIAGNOSIS — I4589 Other specified conduction disorders: Secondary | ICD-10-CM

## 2015-06-26 DIAGNOSIS — N289 Disorder of kidney and ureter, unspecified: Secondary | ICD-10-CM

## 2015-06-26 DIAGNOSIS — G931 Anoxic brain damage, not elsewhere classified: Secondary | ICD-10-CM | POA: Insufficient documentation

## 2015-06-26 DIAGNOSIS — I442 Atrioventricular block, complete: Secondary | ICD-10-CM | POA: Diagnosis present

## 2015-06-26 DIAGNOSIS — I469 Cardiac arrest, cause unspecified: Secondary | ICD-10-CM

## 2015-06-26 DIAGNOSIS — G40901 Epilepsy, unspecified, not intractable, with status epilepticus: Secondary | ICD-10-CM

## 2015-06-26 LAB — GLUCOSE, CAPILLARY
GLUCOSE-CAPILLARY: 148 mg/dL — AB (ref 65–99)
GLUCOSE-CAPILLARY: 191 mg/dL — AB (ref 65–99)
GLUCOSE-CAPILLARY: 210 mg/dL — AB (ref 65–99)
GLUCOSE-CAPILLARY: 228 mg/dL — AB (ref 65–99)
GLUCOSE-CAPILLARY: 91 mg/dL (ref 65–99)
Glucose-Capillary: 168 mg/dL — ABNORMAL HIGH (ref 65–99)

## 2015-06-26 LAB — BASIC METABOLIC PANEL
ANION GAP: 9 (ref 5–15)
ANION GAP: 11 (ref 5–15)
Anion gap: 11 (ref 5–15)
BUN: 32 mg/dL — AB (ref 6–20)
BUN: 30 mg/dL — AB (ref 6–20)
BUN: 28 mg/dL — ABNORMAL HIGH (ref 6–20)
CHLORIDE: 98 mmol/L — AB (ref 101–111)
CHLORIDE: 98 mmol/L — AB (ref 101–111)
CHLORIDE: 98 mmol/L — AB (ref 101–111)
CO2: 23 mmol/L (ref 22–32)
CO2: 24 mmol/L (ref 22–32)
CO2: 23 mmol/L (ref 22–32)
CREATININE: 2.52 mg/dL — AB (ref 0.61–1.24)
Calcium: 8.5 mg/dL — ABNORMAL LOW (ref 8.9–10.3)
Calcium: 8.3 mg/dL — ABNORMAL LOW (ref 8.9–10.3)
Calcium: 8.4 mg/dL — ABNORMAL LOW (ref 8.9–10.3)
Creatinine, Ser: 2.26 mg/dL — ABNORMAL HIGH (ref 0.61–1.24)
Creatinine, Ser: 1.62 mg/dL — ABNORMAL HIGH (ref 0.61–1.24)
GFR calc Af Amer: 29 mL/min — ABNORMAL LOW (ref >60)
GFR calc non Af Amer: 25 mL/min — ABNORMAL LOW (ref >60)
GFR calc non Af Amer: 29 mL/min — ABNORMAL LOW (ref >60)
GFR calc non Af Amer: 43 mL/min — ABNORMAL LOW (ref >60)
GFR, EST AFRICAN AMERICAN: 34 mL/min — AB (ref >60)
GFR, EST AFRICAN AMERICAN: 50 mL/min — AB (ref >60)
GLUCOSE: 208 mg/dL — AB (ref 65–99)
Glucose, Bld: 208 mg/dL — ABNORMAL HIGH (ref 65–99)
Glucose, Bld: 117 mg/dL — ABNORMAL HIGH (ref 65–99)
POTASSIUM: 3.4 mmol/L — AB (ref 3.5–5.1)
POTASSIUM: 3.5 mmol/L (ref 3.5–5.1)
POTASSIUM: 3.7 mmol/L (ref 3.5–5.1)
SODIUM: 131 mmol/L — AB (ref 135–145)
SODIUM: 132 mmol/L — AB (ref 135–145)
Sodium: 132 mmol/L — ABNORMAL LOW (ref 135–145)

## 2015-06-26 LAB — CBC
HEMATOCRIT: 49.4 % (ref 39.0–52.0)
HEMOGLOBIN: 16.4 g/dL (ref 13.0–17.0)
MCH: 30.8 pg (ref 26.0–34.0)
MCHC: 33.2 g/dL (ref 30.0–36.0)
MCV: 92.7 fL (ref 78.0–100.0)
Platelets: 97 10*3/uL — ABNORMAL LOW (ref 150–400)
RBC: 5.33 MIL/uL (ref 4.22–5.81)
RDW: 15.5 % (ref 11.5–15.5)
WBC: 13.1 10*3/uL — ABNORMAL HIGH (ref 4.0–10.5)

## 2015-06-26 LAB — BLOOD GAS, ARTERIAL
ACID-BASE DEFICIT: 1.6 mmol/L (ref 0.0–2.0)
BICARBONATE: 23.1 meq/L (ref 20.0–24.0)
Drawn by: 36527
FIO2: 1
LHR: 24 {breaths}/min
MECHVT: 550 mL
O2 SAT: 99.4 %
PATIENT TEMPERATURE: 104.5
PEEP/CPAP: 5 cmH2O
PH ART: 7.308 — AB (ref 7.350–7.450)
PO2 ART: 248 mmHg — AB (ref 80.0–100.0)
TCO2: 24.4 mmol/L (ref 0–100)
pCO2 arterial: 49.8 mmHg — ABNORMAL HIGH (ref 35.0–45.0)

## 2015-06-26 LAB — URINE CULTURE: Culture: NO GROWTH

## 2015-06-26 LAB — HEPARIN LEVEL (UNFRACTIONATED)
HEPARIN UNFRACTIONATED: 0.28 [IU]/mL — AB (ref 0.30–0.70)
Heparin Unfractionated: 0.15 IU/mL — ABNORMAL LOW (ref 0.30–0.70)

## 2015-06-26 LAB — MAGNESIUM: MAGNESIUM: 1.7 mg/dL (ref 1.7–2.4)

## 2015-06-26 LAB — TROPONIN I
TROPONIN I: 0.43 ng/mL — AB (ref <0.031)
Troponin I: 0.27 ng/mL — ABNORMAL HIGH (ref <0.031)

## 2015-06-26 LAB — PHOSPHORUS: Phosphorus: 1.9 mg/dL — ABNORMAL LOW (ref 2.5–4.6)

## 2015-06-26 LAB — AST: AST: 640 U/L — ABNORMAL HIGH (ref 15–41)

## 2015-06-26 LAB — ALT: ALT: 433 U/L — ABNORMAL HIGH (ref 17–63)

## 2015-06-26 MED ORDER — PRO-STAT SUGAR FREE PO LIQD
60.0000 mL | Freq: Three times a day (TID) | ORAL | Status: DC
  Administered 2015-06-26 – 2015-06-27 (×5): 60 mL
  Filled 2015-06-26 (×5): qty 60

## 2015-06-26 MED ORDER — VITAL HIGH PROTEIN PO LIQD
1000.0000 mL | ORAL | Status: DC
  Filled 2015-06-26 (×2): qty 1000

## 2015-06-26 MED ORDER — HEPARIN (PORCINE) IN NACL 100-0.45 UNIT/ML-% IJ SOLN
1500.0000 [IU]/h | INTRAMUSCULAR | Status: DC
  Administered 2015-06-27 – 2015-06-28 (×2): 1500 [IU]/h via INTRAVENOUS
  Filled 2015-06-26 (×3): qty 250

## 2015-06-26 MED ORDER — MAGNESIUM SULFATE 4 GM/100ML IV SOLN
4.0000 g | Freq: Once | INTRAVENOUS | Status: AC
  Administered 2015-06-26: 4 g via INTRAVENOUS
  Filled 2015-06-26: qty 100

## 2015-06-26 MED ORDER — ANTISEPTIC ORAL RINSE SOLUTION (CORINZ)
7.0000 mL | OROMUCOSAL | Status: DC
  Administered 2015-06-26 – 2015-06-28 (×28): 7 mL via OROMUCOSAL

## 2015-06-26 MED ORDER — PERFLUTREN LIPID MICROSPHERE
INTRAVENOUS | Status: AC
Start: 2015-06-26 — End: 2015-06-26
  Administered 2015-06-26: 15:00:00
  Filled 2015-06-26: qty 10

## 2015-06-26 MED ORDER — SODIUM CHLORIDE 0.9 % IV SOLN
1300.0000 mg | Freq: Once | INTRAVENOUS | Status: AC
  Administered 2015-06-26: 1300 mg via INTRAVENOUS
  Filled 2015-06-26: qty 26

## 2015-06-26 MED ORDER — CHLORHEXIDINE GLUCONATE 0.12 % MT SOLN
15.0000 mL | Freq: Two times a day (BID) | OROMUCOSAL | Status: DC
  Administered 2015-06-26 – 2015-06-28 (×6): 15 mL via OROMUCOSAL
  Filled 2015-06-26: qty 15

## 2015-06-26 MED ORDER — MAGNESIUM SULFATE 50 % IJ SOLN
4.0000 g | Freq: Once | INTRAVENOUS | Status: DC
  Filled 2015-06-26: qty 8

## 2015-06-26 MED ORDER — DOPAMINE-DEXTROSE 3.2-5 MG/ML-% IV SOLN
5.0000 ug/kg/min | INTRAVENOUS | Status: DC
  Administered 2015-06-26: 2 ug/kg/min via INTRAVENOUS
  Filled 2015-06-26: qty 250

## 2015-06-26 MED ORDER — ACETAMINOPHEN 160 MG/5ML PO SOLN
650.0000 mg | Freq: Four times a day (QID) | ORAL | Status: DC | PRN
  Administered 2015-06-26 – 2015-06-28 (×6): 650 mg
  Filled 2015-06-26 (×6): qty 20.3

## 2015-06-26 MED ORDER — VITAL HIGH PROTEIN PO LIQD
1000.0000 mL | ORAL | Status: DC
  Administered 2015-06-26 – 2015-06-28 (×3): 1000 mL
  Filled 2015-06-26 (×4): qty 1000

## 2015-06-26 MED ORDER — MIDAZOLAM HCL 2 MG/2ML IJ SOLN
2.0000 mg | INTRAMUSCULAR | Status: DC | PRN
  Administered 2015-06-26: 4 mg via INTRAVENOUS
  Administered 2015-06-26: 2 mg via INTRAVENOUS
  Administered 2015-06-26 (×2): 4 mg via INTRAVENOUS
  Filled 2015-06-26 (×2): qty 4
  Filled 2015-06-26: qty 2
  Filled 2015-06-26 (×2): qty 4

## 2015-06-26 MED ORDER — POTASSIUM PHOSPHATES 15 MMOLE/5ML IV SOLN
30.0000 mmol | Freq: Once | INTRAVENOUS | Status: AC
  Administered 2015-06-26: 30 mmol via INTRAVENOUS
  Filled 2015-06-26: qty 10

## 2015-06-26 NOTE — Progress Notes (Signed)
eLink Physician-Brief Progress Note Patient Name: Andre [name] DOB: 1951-04-21 MRN: [id number]   Date of Service  06/26/2015  HPI/Events of Note  Patient with ongoing movements suggestive of myclonic jerks.  Alarming the vent.  Also with temp of 103.  eICU Interventions  Plan: Increase PRN versed to 2 to 4 mg prn Tylenol for temp - blood cultures drawn earlier.     Intervention Category Intermediate Interventions: Other:  DETERDING,ELIZABETH 06/26/2015, 12:54 AM

## 2015-06-26 NOTE — Progress Notes (Addendum)
Subjective:  Intubated, some mouth twitching and biting movements noted, otherwise not responsive.  Objective:  Vital Signs in the last 24 hours: Temp:  [99.5 F (37.5 C)-104.5 F (40.3 C)] 100.9 F (38.3 C) (12/01 0922) Pulse Rate:  [30-120] 120 (12/01 0922) Resp:  [14-26] 24 (12/01 0922) BP: (98-148)/(51-118) 123/64 mmHg (11/30 2100) SpO2:  [88 %-100 %] 100 % (12/01 0922) Arterial Line BP: (87-200)/(43-85) 159/63 mmHg (12/01 0800) FiO2 (%):  [40 %-100 %] 40 % (12/01 0922) Weight:  [188 lb 11.4 oz (85.6 kg)-250 lb (113.399 kg)] 188 lb 11.4 oz (85.6 kg) (12/01 0500)  Intake/Output from previous day:  Intake/Output Summary (Last 24 hours) at 06/26/15 1006 Last data filed at 06/26/15 0809  Gross per 24 hour  Intake 1702.29 ml  Output   1125 ml  Net 577.29 ml    Physical Exam: General appearance: moderately obese and intubated, not responsive Lungs: clear Heart: irregularly irregular rhythm Extremities: no edema Neurologic: Not responsive   Rate: 90-110  Rhythm: indeterminate and at times he appears to be in NSR with RBBB, at times he appears to be in AV disassociation with VR of 90  Lab Results:  Recent Labs  06/03/2015 1700 06/26/15 0530  WBC 9.6 13.1*  HGB 16.9 16.4  PLT 118* 97*    Recent Labs  06/21/2015 2020 06/26/15 0530  NA 130* 132*  K 4.2 3.4*  CL 98* 98*  CO2 22 23  GLUCOSE 190* 208*  BUN 29* 32*  CREATININE 2.36* 2.52*    Recent Labs  05/27/2015 2330 06/26/15 0530  TROPONINI 0.27* 0.43*    Recent Labs  06/14/2015 1902  INR 1.35    Scheduled Meds: . sodium chloride  250 mL Intravenous Once  . ampicillin-sulbactam (UNASYN) IV  1.5 g Intravenous Q12H  . antiseptic oral rinse  7 mL Mouth Rinse Q2H  . chlorhexidine  15 mL Mouth/Throat BID  . feeding supplement (VITAL HIGH PROTEIN)  1,000 mL Per Tube Q24H  . insulin aspart  0-9 Units Subcutaneous 6 times per day  . magnesium sulfate 1 - 4 g bolus IVPB  4 g Intravenous Once  .  pantoprazole (PROTONIX) IV  40 mg Intravenous QHS  . phenytoin (DILANTIN) IV  1,300 mg Intravenous Once  . potassium phosphate IVPB (mmol)  30 mmol Intravenous Once   Continuous Infusions: . sodium chloride 10 mL/hr at 06/26/15 0000  . DOPamine    . heparin 1,250 Units/hr (06/26/15 0747)  . isoproterenol (ISUPREL) infusion 8 mcg/min (06/26/15 0920)   PRN Meds:.sodium chloride, acetaminophen (TYLENOL) oral liquid 160 mg/5 mL, etomidate, midazolam, rocuronium   Imaging: Dg Chest Port 1 View  06/26/2015  CLINICAL DATA:  Respiratory failure. EXAM: PORTABLE CHEST 1 VIEW COMPARISON:  06/18/2015.  01/26/2015 .  01/22/2015. FINDINGS: Endotracheal tube and NG tube in stable position. Right hilar prominence noted. This could be from prominent vascularity. Right hilar mass cannot be excluded . Stable cardiomegaly. Improving right upper lobe infiltrate. Mild bilateral interstitial prominence. A component of congestive heart failure cannot be excluded . Low lung volumes with basilar atelectasis. Small right pleural effusion. No pneumothorax. IMPRESSION: 1. Lines and tubes in stable position. 2. Improving right upper lobe infiltrate. 3. Cardiomegaly with mild diffuse bilateral pulmonary interstitial prominence. A component of mild congestive heart failure may be present. Small right pleural effusion. 4. Right hilar fullness. Although this may be vascular a right hilar mass cannot be excluded. PA and lateral chest x-ray when the patient's clinically capable would  be useful for further evaluation. Electronically Signed   By: Maisie Fus  Register   On: 06/26/2015 07:20     Cardiac Studies: Echo pending  Assessment/Plan:  64 y.o. male with unknown past medical history who presented to [facility] ED on Jul 04, 2015 following a cardiac arrest. He was found down by his co workers. No clear PMH, no records in [facility] or Care Everywhere though he was apparently on Amiodarone and Theophylline prior to admission. He was treated  with TPA on admission for presumed pulmonary embolism- D-dimer 19, no CTA done.  Since admission he had some seizure activity noted and EEG done apparently confirmed seizure activity- portable head CT pending. He is currently on Isuprel and Heparin.    Principal Problem:   Out of hospital cardiac arrest [facility]) Active Problems:   Respiratory arrest (HCC)   Shock (HCC)   Complete heart block (HCC)   RBBB   Renal insufficiency-unknown duration   Seizure (HCC)   PLAN: Head CT ordered, echo pending. Isuprel to be changed to Dopamine.   Corine Shelter PA-C 06/26/2015, 10:06 AM [phone]  Patient seen and examined and history reviewed. Agree with above findings and plan.  1. Post cardiopulmonary arrest. Most likely cause is acute PE. Cardiac enzymes minimally elevated consistent with demand ischemia. BP stable. Rhythm sinus with AV dissociation and junctional escape rate 55-60 on Isuprel. RBBB. He did have some complete heart block last night with marked brady improved with IV Isuprel.  Awaiting complete Echo. Apparently on amiodarone in past. Will hold given bradycardia.  2. Seizure activity. Plan head CT. Per Neuro 3. Acute respiratory failure on ventilator per CCM 4. ARF no baseline for comparison.  5. Complete heart block. HR better with Isuprel. Plan to transition to IV dopamine if tolerated.   Patient remains critically ill. Febrile. Prognosis is guarded.  Peter [place], MDFACC 06/26/2015 10:43 AM

## 2015-06-26 NOTE — Procedures (Signed)
ELECTROENCEPHALOGRAM REPORT   Patient: Andre [name]      Room #: 2H-14 Age: 64 y.o.        Sex: male Referring Physician: Dr Tyson AliasFeinstein Report Date:  06/26/2015        Interpreting Physician: [name], [name]  History: Andre [name] is an 64 y.o. male s/p cardiac arrest  Medications:  I have reviewed the patient's current medications.  Conditions of Recording:  This is a 16 channel EEG carried out with the patient in the intubated and unresponsive state. Not currently on any sedating medications.  Description:  The background activity consists of a low to medium voltage, slow activity. No posterior dominant alpha rhythm noted. The recording is notable for continuous 1 Hz generalized sharp wave activity. No clinical manifestation of these sharp waves is noted. No clear evolution is noted.   Hyperventilation was not performed. Intermittent photic stimulation was not performed.  IMPRESSION: Abnormal EEG due to the following:  1)Suppression of the normal background rhythm 2)Generalized Periodic Epileptiform Discharges (GPEDs). In the setting of anoxic injury this can be a poor prognostic indicator   Elspeth Choeter Myia Bergh, DO [facility] [phone]  If 7pm- 7am, please page neurology on call as listed in AMION. 06/26/2015, 11:03 AM

## 2015-06-26 NOTE — Progress Notes (Signed)
*  PRELIMINARY RESULTS* Echocardiogram 2D Echocardiogram has been performed.  Jeryl Columbialliott, Crystalyn Delia 06/26/2015, 3:06 PM

## 2015-06-26 NOTE — Progress Notes (Signed)
Notified Dr Tresa EndoKelly re:[facility] w/ order for isuprel.

## 2015-06-26 NOTE — Progress Notes (Signed)
Pt w/ continued myoclonus unrelieved by versed and temp continued to increase since the beginning of the shift-Dr Deterding made aware.

## 2015-06-26 NOTE — Progress Notes (Signed)
LTM EEG started 

## 2015-06-26 NOTE — Progress Notes (Signed)
Initial Nutrition Assessment  DOCUMENTATION CODES:   Obesity unspecified  INTERVENTION:   Initiate Vital High Protein @ 20 ml/hr via OG tube 60 ml Prostat TID  Provides: 1080 kcal, 132 grams protein, and 401 ml H2O.  NUTRITION DIAGNOSIS:   Inadequate oral intake related to inability to eat as evidenced by NPO status.  GOAL:   Provide needs based on ASPEN/SCCM guidelines  MONITOR:   Vent status, Labs, Weight trends, TF tolerance  REASON FOR ASSESSMENT:   Consult Enteral/tube feeding initiation and management  ASSESSMENT:   64 yr old male limited history available. 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 Found at work , asystole, new RBBB, tpa empiric  Patient is currently intubated on ventilator support MV: 15.5 L/min Temp (24hrs), Avg:101.9 F (38.8 C), Min:99.5 F (37.5 C), Max:104.5 F (40.3 C)  Medications reviewed and include: KPhos Discussed with RN. No family present. Possible anoxic injury.   Diet Order:  Diet NPO time specified  Skin:  Reviewed, no issues  Last BM:  unknown  Height:   Ht Readings from Last 1 Encounters:  01/10/15 5\' 6"  (1.676 m)   Weight:   Wt Readings from Last 1 Encounters:  06/26/15 188 lb 11.4 oz (85.6 kg)   Ideal Body Weight:  64.5 kg  BMI:  Body mass index is 30.47 kg/(m^2).  Estimated Nutritional Needs:   Kcal:  [phone]  Protein:  >/= 129 grams  Fluid:  >1.5 L/day  EDUCATION NEEDS:   No education needs identified at this time  Kendell BaneHeather Adrine Hayworth RD, LDN, CNSC [phone] Pager [phone] After Hours Pager

## 2015-06-26 NOTE — Progress Notes (Signed)
EEG Completed; Results Pending  

## 2015-06-26 NOTE — Progress Notes (Signed)
ANTICOAGULATION CONSULT NOTE - FOLLOW UP    HL = 0.28 (goal 0.3 - 0.5 units/mL while within 24 hours of receiving tPA) Heparin dosing weight = 80 kg   Assessment: 64 YOM s/p tPA to continue on IV heparin for PE.  Heparin level is slightly below goal.  Noted patient has thrombocytopenia, but no bleeding reported.  No complication with heparin infusion per RN.   Plan: - Increase heparin gtt to 1400 units/hr - Check 6 hr HL - Watch closely for bleeding    Maryjean Corpening D. Laney Potashang, PharmD, BCPS 06/26/2015, 4:21 PM

## 2015-06-26 NOTE — H&P (Signed)
PULMONARY / CRITICAL CARE MEDICINE   Name: Andre [name] MRN: [id number] DOB: [birth year]    ADMISSION DATE:  Jul 02, 2015 CONSULTATION DATE:  Jul 02, 2015  CHIEF COMPLAINT: Hyacinth Meeker, MD  HISTORY OF PRESENT ILLNESS:   64 yr old male limited history available. 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 Found at work , asystole, new RBBB, tpa empiric  SUBJECTIVE: myoclonus worsening  VITAL SIGNS: BP 123/64 mmHg  Pulse 59  Temp(Src) 101.1 F (38.4 C) (Core (Comment))  Resp 26  Ht  (1.676 m)  Wt 85.6 kg (188 lb 11.4 oz)  BMI 30.47 kg/m2  SpO2 100%  HEMODYNAMICS:    VENTILATOR SETTINGS: Vent Mode:  [-] PRVC FiO2 (%):  [40 %-100 %] 40 % Set Rate:  [14 bmp-24 bmp] 24 bmp Vt Set:  [550 mL] 550 mL PEEP:  [5 cmH20] 5 cmH20 Plateau Pressure:  [23 cmH20-27 cmH20] 23 cmH20  INTAKE / OUTPUT: I/O last 3 completed shifts: In: 1441.8 [I.V.:1341.8; NG/GT:50; IV Piggyback:50] Out: 1025 [Urine:625; Emesis/NG output:200; Other:200]  PHYSICAL EXAMINATION: General:  eeg on going Neuro:  Per sluggish 2 mm, react, move no ext, prio rmyoclonus uppers resolved now, no gag , no cough HEENT:  jvd noted, collar Cardiovascular:  s1 s2 distant, HR improved Lungs:  ronchi bilat Abdomen:  Soft, BS hypo, nt, nd, no brusing anywhere Musculoskeletal:  No hematoma, no bruising Skin:  No rash  LABS:  CBC  Recent Labs Lab 2015/07/02 1645 02-Jul-2015 1700 06/26/15 0530  WBC  --  9.6 13.1*  HGB 18.7* 16.9 16.4  HCT 55.0* 51.0 49.4  PLT  --  118* 97*   Coag's  Recent Labs Lab 07-02-15 1902 2015-07-02 2222  APTT 32 28  INR 1.35  --    BMET  Recent Labs Lab 2015/07/02 1700 July 02, 2015 2020 06/26/15 0530  NA 131* 130* 132*  K 4.8 4.2 3.4*  CL 93* 98* 98*  CO2 BUN 29* 29* 32*  CREATININE 2.96* 2.36* 2.52*  GLUCOSE 213* 190* 208*   Electrolytes  Recent Labs Lab 07-02-15 1700 07-02-2015 1830 07-02-15 2020 06/26/15 0530  CALCIUM 8.9  --  8.6* 8.5*  MG  --  2.3   --  1.7  PHOS  --  6.1*  --  1.9*   Sepsis Markers  Recent Labs Lab 07-02-15 1646 Jul 02, 2015 1830 07/02/2015 1902  LATICACIDVEN 4.35*  --  2.6*  PROCALCITON  --  1.04  --    ABG  Recent Labs Lab 07/02/15 1825 2015-07-02 2355 06/26/15 0400  PHART 7.369 7.296* 7.308*  PCO2ART 40.4 69.6* 49.8*  PO2ART 79.0* 183.0* 248*   Liver Enzymes  Recent Labs Lab 07-02-15 1700  AST 714*  ALT 316*  ALKPHOS 120  BILITOT 1.2  ALBUMIN 3.1*   Cardiac Enzymes  Recent Labs Lab July 02, 2015 1700 2015/07/02 2330 06/26/15 0530  TROPONINI 0.17* 0.27* 0.43*   Glucose  Recent Labs Lab 02-Jul-2015 1633 July 02, 2015 2024 06/26/15 0005 06/26/15 0538  GLUCAP 212* 168* 148* 191*    Imaging Dg Chest Port 1 View  06/26/2015  CLINICAL DATA:  Respiratory failure. EXAM: PORTABLE CHEST 1 VIEW COMPARISON:  2015-07-02.  01/26/2015 .  01/22/2015. FINDINGS: Endotracheal tube and NG tube in stable position. Right hilar prominence noted. This could be from prominent vascularity. Right hilar mass cannot be excluded . Stable cardiomegaly. Improving right upper lobe infiltrate. Mild bilateral interstitial prominence. A component of congestive heart failure cannot be excluded . Low lung volumes with basilar atelectasis. Small right  pleural effusion. No pneumothorax. IMPRESSION: 1. Lines and tubes in stable position. 2. Improving right upper lobe infiltrate. 3. Cardiomegaly with mild diffuse bilateral pulmonary interstitial prominence. A component of mild congestive heart failure may be present. Small right pleural effusion. 4. Right hilar fullness. Although this may be vascular a right hilar mass cannot be excluded. PA and lateral chest x-ray when the patient's clinically capable would be useful for further evaluation. Electronically Signed   By: Maisie Fushomas  Register   On: 06/26/2015 07:20   Dg Chest Port 1 View  06/20/2015  CLINICAL DATA:  Central line placement. Cardiac arrest. On ventilator. EXAM: PORTABLE CHEST 1 VIEW  COMPARISON:  Prior today FINDINGS: Endotracheal tube is been pulled back with tip now approximately 4.5 cm above carina. Nasogastric tube remains in place with tip in distal stomach. Airspace opacity is seen in the peripheral right upper lobe which is increased since previous study. Left lung remains clear. Heart size is stable. No pneumothorax visualized. IMPRESSION: Endotracheal tube in appropriate position. Increased airspace opacity in peripheral right upper lobe. Electronically Signed   By: Myles RosenthalJohn  Stahl M.D.   On: 06/17/2015 20:09   Dg Chest Portable 1 View  06/19/2015  CLINICAL DATA:  Post CPR. EXAM: PORTABLE CHEST 1 VIEW COMPARISON:  Chest x-ray dated 02/19/2015. FINDINGS: The endotracheal tube tip is slightly into the right mainstem bronchus. Cardiomediastinal silhouette is stable in size given the supine patient positioning. Patchy airspace opacities are seen bilaterally, upper lobe predominant, most likely edema. No pleural effusions seen. No pneumothorax seen. No osseous fracture or dislocation seen. Pacer pads overlie the left heart shadow. Enteric tube passes below the diaphragm. IMPRESSION: 1. Endotracheal tube tip slightly into the right mainstem bronchus. Recommend retracting 2-3 cm. 2. Patchy airspace opacities bilaterally, most likely edema. These results were called by telephone at the time of interpretation on 06/18/2015 at 4:54 pm to Dr. Eber HongBRIAN MILLER , who verbally acknowledged these results. Electronically Signed   By: Bary RichardStan  Maynard M.D.   On: 06/20/2015 16:55     STUDIES:  Echo 11/30 LImited echo>>>poor rv and lf fxn, rv dilated and hypokinetic, no sig effusion, poor images at times on vent  CULTURES: Sputum 11/30>>>  ANTIBIOTICS: unasyn 11/30>>>  SIGNIFICANT EVENTS: 11/30 unwitnessed arrest, brady, HB in ed  LINES/TUBES: 11/30 rt fem a line>>> 11/30 rt fem cvl>>>   ASSESSMENT / PLAN:  PULMONARY A: Acute resp failure, r/o PE, uncompendated acidosis P:   ABG noted,  consider increase rat event, repeat abg in am  pcxr with edema, if desat, add lasix pcxr in am   CARDIOVASCULAR A:  S/p Arrest, RBBB new?, r/o PE, Heart Block P:  Doppler legs neg pre lim isopril per cards If drops BP add dopamine Tele Official echo pending May need to reduce isop if BP rises Consider transition to dopamine  RENAL A:   ARF, unknown baseline , hypok, mag,phos P:   Concentrate drips May need lasix bmet to am  k supp  GASTROINTESTINAL A:   Likely shock liver P:   LFT in am  feed ppi  HEMATOLOGIC A:   Polycythemia (r/o hypovolemia, chronic hypoxia?), r/o dvt, PE, s/p tpa, slight drop plat, no bleeding noted P:  Cbc in am  Heparin transition per pharmacy  INFECTIOUS A:   Concern aspiration event P:   11/30 unasyn  11/30 sputum 11/30 BC>>>  Maintain  ENDOCRINE A:   Hyperglycemia, r/o DM P:   ssi Get tsh - 1.3 Get cortisol 36, no role  stress roids  NEUROLOGIC A:   S/p arrest, [place] significant anoxia, NO evidence trauma, myoclonus P:   RASS goal: 0 Versed int for myoclonus  eeg now likley poor outcome, contacting family  FAMILY  - Updates: I looked for family. 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 Tyson Alias, MD, FACP Pgr: [phone] [facility]  Pulmonary and Critical Care Medicine [facility] Pager: [phone]  06/26/2015, 8:45 AM

## 2015-06-26 NOTE — Consult Note (Signed)
NEURO HOSPITALIST CONSULT NOTE   Requestig physician: Dr.  Tyson Alias   Reason for Consult: To evaluate for seizures, anoxic brain injury secondary to cardiac arrest, unknown downtime  HPI:                                                                                                                                          Andre [name] is an 64 y.o. male was admitted to the ICU, intubated. He was found down on the ground without trauma. Unknown downtime. He was in asystole, new right bundle branch block. He was noted to have myoclonic jerks since admission involving all extremities and is been receiving very frequent doses of Versed. EEG was done this morning, brief review at bedside showed very frequent epileptiform discharges predominantly in the right parietal region. Patient did not have any twitching or convulsive activity during the routine EEG recording. But after the EEG was discontinued, he was noted to have near continuous left facial twitching, and tongue movements. Nursing staff reported seeing frequent limb jerking movements throughout the night since admission. No family available at bedside.   History reviewed. No pertinent past medical history.  History reviewed. No pertinent past surgical history.  History reviewed. No pertinent family history.  Family History: Unable to obtain  Social History:  has no tobacco, alcohol, and drug history on file. Unable to obtain  Not on File  MEDICATIONS:                                                                                                                      Current facility-administered medications:  .  0.9 %  sodium chloride infusion, 250 mL, Intravenous, PRN, Bernadene Person, NP .  0.9 %  sodium chloride infusion, 250 mL, Intravenous, Once, Nelda Bucks, MD .  0.9 %  sodium chloride infusion, , Intravenous, Continuous, Nelda Bucks, MD, Last Rate: 10 mL/hr at 06/26/15 0000 .   acetaminophen (TYLENOL) solution 650 mg, 650 mg, Per Tube, Q6H PRN, Zigmund Gottron, MD, 650 mg at 06/26/15 0120 .  ampicillin-sulbactam (UNASYN) 1.5 g in sodium chloride 0.9 % 50 mL IVPB, 1.5 g, Intravenous, Q12H, Nelda Bucks, MD, 1.5 g at 06/26/15 0809 .  antiseptic oral rinse solution (CORINZ), 7 mL, Mouth  Rinse, Q2H, Nelda Bucks, MD, 7 mL at 06/26/15 0800 .  chlorhexidine (PERIDEX) 0.12 % solution 15 mL, 15 mL, Mouth/Throat, BID, Nelda Bucks, MD, 15 mL at 06/26/15 0007 .  DOPamine (INTROPIN) 800 mg in dextrose 5 % 250 mL (3.2 mg/mL) infusion, 5 mcg/kg/min, Intravenous, Continuous, Nelda Bucks, MD .  etomidate (AMIDATE) injection, , Intravenous, PRN, Eber Hong, MD, 10 mg at 06/04/2015 1618 .  feeding supplement (VITAL HIGH PROTEIN) liquid 1,000 mL, 1,000 mL, Per Tube, Q24H, Nelda Bucks, MD .  heparin ADULT infusion 100 units/mL (25000 units/250 mL), 1,250 Units/hr, Intravenous, Continuous, Nelda Bucks, MD, Last Rate: 12.5 mL/hr at 06/26/15 0747, 1,250 Units/hr at 06/26/15 0747 .  insulin aspart (novoLOG) injection 0-9 Units, 0-9 Units, Subcutaneous, 6 times per day, Nelda Bucks, MD, 2 Units at 06/26/15 [phone] .  isoproterenol (ISUPREL) 1 mg in dextrose 5 % 250 mL (0.004 mg/mL) infusion, 2-20 mcg/min, Intravenous, Titrated, Leeann Must, MD, Last Rate: 120 mL/hr at 06/26/15 0920, 8 mcg/min at 06/26/15 0920 .  magnesium sulfate IVPB 4 g 100 mL, 4 g, Intravenous, Once, Nelda Bucks, MD, 4 g at 06/26/15 1001 .  midazolam (VERSED) injection 2-4 mg, 2-4 mg, Intravenous, Q1H PRN, Zigmund Gottron, MD, 4 mg at 06/26/15 0600 .  pantoprazole (PROTONIX) injection 40 mg, 40 mg, Intravenous, QHS, Bernadene Person, NP, 40 mg at 06/10/2015 2247 .  phenytoin (DILANTIN) 1,300 mg in sodium chloride 0.9 % 250 mL IVPB, 1,300 mg, Intravenous, Once, Keyen Marban [employer], MD .  potassium phosphate 30 mmol in dextrose 5 % 500 mL infusion, 30 mmol,  Intravenous, Once, Nelda Bucks, MD, 30 mmol at 06/26/15 1000 .  rocuronium (ZEMURON) injection, , Intravenous, PRN, Eber Hong, MD, 100 mg at 06/10/2015 1618   ROS:                                                                                                                                       History obtained from unobtainable from patient due to mental status   Blood pressure 123/64, pulse 120, temperature 100.9 F (38.3 C), temperature source Core (Comment), resp. rate 24, height  (1.676 m), weight 85.6 kg (188 lb 11.4 oz), SpO2 100 %.   Neurologic Examination:                                                                                                      Patient intubated, sedated due to frequent  Versed doses.  No specific gaze deviation,noted pupils 3-4 mm bilaterally equal symmetric reactive. Facial creases grossly appear symmetric. No corneal b/l, no gag.  No motor movement or posturing with tactile stimulation.   Towards the end of examination this morning, patient was noted to have left facial twitching around the corner of the mouth and also some abnormal tongue movements. EEG was discontinued at that time.     Lab Results: Basic Metabolic Panel:  Recent Labs Lab 06/15/2015 1645 06/17/2015 1700 06/21/2015 1830 05/29/2015 2020 06/26/15 0530  NA 132* 131*  --  130* 132*  K 4.9 4.8  --  4.2 3.4*  CL 93* 93*  --  98* 98*  CO2  --  27  --  22 23  GLUCOSE 214* 213*  --  190* 208*  BUN 39* 29*  --  29* 32*  CREATININE 2.70* 2.96*  --  2.36* 2.52*  CALCIUM  --  8.9  --  8.6* 8.5*  MG  --   --  2.3  --  1.7  PHOS  --   --  6.1*  --  1.9*    Liver Function Tests:  Recent Labs Lab 06/11/2015 1700  AST 714*  ALT 316*  ALKPHOS 120  BILITOT 1.2  PROT 6.8  ALBUMIN 3.1*   No results for input(s): LIPASE, AMYLASE in the last 168 hours. No results for input(s): AMMONIA in the last 168 hours.  CBC:  Recent Labs Lab 06/11/2015 1645 06/11/2015 1700  06/26/15 0530  WBC  --  9.6 13.1*  NEUTROABS  --  7.2  --   HGB 18.7* 16.9 16.4  HCT 55.0* 51.0 49.4  MCV  --  94.3 92.7  PLT  --  118* 97*    Cardiac Enzymes:  Recent Labs Lab 06/14/2015 1700 05/30/2015 2330 06/26/15 0530  TROPONINI 0.17* 0.27* 0.43*    Lipid Panel: No results for input(s): CHOL, TRIG, HDL, CHOLHDL, VLDL, LDLCALC in the last 168 hours.  CBG:  Recent Labs Lab 06/17/2015 1633 06/18/2015 2024 06/26/15 0005 06/26/15 0538 06/26/15 0756  GLUCAP 212* 168* 148* 191* 168*    Microbiology: Results for orders placed or performed during the hospital encounter of 06/18/2015  MRSA PCR Screening     Status: None   Collection Time: 06/02/2015  6:48 PM  Result Value Ref Range Status   MRSA by PCR NEGATIVE NEGATIVE Final    Comment:        The GeneXpert MRSA Assay (FDA approved for NASAL specimens only), is one component of a comprehensive MRSA colonization surveillance program. It is not intended to diagnose MRSA infection nor to guide or monitor treatment for MRSA infections.   Culture, respiratory (NON-Expectorated)     Status: None (Preliminary result)   Collection Time: 06/10/2015 10:07 PM  Result Value Ref Range Status   Specimen Description TRACHEAL ASPIRATE  Final   Special Requests NONE  Final   Gram Stain   Final    ABUNDANT WBC PRESENT, PREDOMINANTLY PMN NO SQUAMOUS EPITHELIAL CELLS SEEN ABUNDANT GRAM POSITIVE COCCI IN PAIRS IN CHAINS Performed at [facility]    Culture   Final    NO GROWTH 1 DAY Performed at [facility]    Report Status PENDING  Incomplete    Coagulation Studies:  Recent Labs  06/22/2015 1902  LABPROT 16.8*  INR 1.35    Imaging: Dg Chest Port 1 View  06/26/2015  CLINICAL DATA:  Respiratory failure. EXAM: PORTABLE CHEST 1 VIEW COMPARISON:  06/19/2015.  01/26/2015 .  01/22/2015. FINDINGS: Endotracheal tube and NG tube in stable position. Right hilar prominence noted. This could be from prominent  vascularity. Right hilar mass cannot be excluded . Stable cardiomegaly. Improving right upper lobe infiltrate. Mild bilateral interstitial prominence. A component of congestive heart failure cannot be excluded . Low lung volumes with basilar atelectasis. Small right pleural effusion. No pneumothorax. IMPRESSION: 1. Lines and tubes in stable position. 2. Improving right upper lobe infiltrate. 3. Cardiomegaly with mild diffuse bilateral pulmonary interstitial prominence. A component of mild congestive heart failure may be present. Small right pleural effusion. 4. Right hilar fullness. Although this may be vascular a right hilar mass cannot be excluded. PA and lateral chest x-ray when the patient's clinically capable would be useful for further evaluation. Electronically Signed   By: Maisie Fushomas  Register   On: 06/26/2015 07:20   Dg Chest Port 1 View  06/02/2015  CLINICAL DATA:  Central line placement. Cardiac arrest. On ventilator. EXAM: PORTABLE CHEST 1 VIEW COMPARISON:  Prior today FINDINGS: Endotracheal tube is been pulled back with tip now approximately 4.5 cm above carina. Nasogastric tube remains in place with tip in distal stomach. Airspace opacity is seen in the peripheral right upper lobe which is increased since previous study. Left lung remains clear. Heart size is stable. No pneumothorax visualized. IMPRESSION: Endotracheal tube in appropriate position. Increased airspace opacity in peripheral right upper lobe. Electronically Signed   By: Myles RosenthalJohn  Stahl M.D.   On: 06/02/2015 20:09   Dg Chest Portable 1 View  05/31/2015  CLINICAL DATA:  Post CPR. EXAM: PORTABLE CHEST 1 VIEW COMPARISON:  Chest x-ray dated 02/19/2015. FINDINGS: The endotracheal tube tip is slightly into the right mainstem bronchus. Cardiomediastinal silhouette is stable in size given the supine patient positioning. Patchy airspace opacities are seen bilaterally, upper lobe predominant, most likely edema. No pleural effusions seen. No  pneumothorax seen. No osseous fracture or dislocation seen. Pacer pads overlie the left heart shadow. Enteric tube passes below the diaphragm. IMPRESSION: 1. Endotracheal tube tip slightly into the right mainstem bronchus. Recommend retracting 2-3 cm. 2. Patchy airspace opacities bilaterally, most likely edema. These results were called by telephone at the time of interpretation on 06/03/2015 at 4:54 pm to Dr. Eber HongBRIAN MILLER , who verbally acknowledged these results. Electronically Signed   By: Bary RichardStan  Maynard M.D.   On: 06/09/2015 16:55     Assessment/Plan:  64 year old male patient, status post cardiac arrest, was found in asystole with unknown downtime. No information about his past medical or neurological history is available per EMR. No family available at bedside.  I recommend obtaining a CT of the brain to evaluate for intracranial pathology. We do not have any brain imaging yet since admission.  EEG was abnormal with very frequent right parietal epileptiform discharges. Clinically patient is noted to have brief periods of left facial twitching and generalized myoclonic limb movements. There is a possibility of status epilepticus secondary to anoxic brain injury. Recommend stat one dose of IV phenytoin 15 mg/kg, we'll continue when necessary Versed for any convulsive or myoclonic activity.  He'll be started on long-term video EEG monitoring.  Overall, guarded neurological prognosis based on unknown downtime, with possible status epilepticus secondary to anoxic brain injury.  Case discussed with ICU attending Dr. Tyson AliasFeinstein.  Neurology service will continue to follow up.  Please call for any further questions.   Addendum: Patient reexamined at 6:30 PM. Bilateral sluggishly reactive symmetric pupils, but no corneals or gag, no motor  response to stimulus. Patient not on any sedation since 6 AM this morning. Reviewed bedside long-term EEG. Generalized background slowing noted likely from severe  anoxic encephalopathy, no electrographic seizures currently.   CT brain reviewed showed right hemispheric severe generalized edema, moderate on the left. There is mass effect on the basal cisterns and fourth ventricle.   Overall based on clinical examination, EEG and CT brain findings, neurological prognosis is very poor. No family available at bedside.   Neurology service will continue to follow up. Please call for any further questions.

## 2015-06-26 NOTE — Progress Notes (Addendum)
Wasted 240mL of fentanyl 2,500mcg/250mL and 40mL of versed 50mg/50mL in sink. Witnessed by Raileigh Sabater, RN.  

## 2015-06-26 DEATH — deceased

## 2015-06-27 ENCOUNTER — Inpatient Hospital Stay (HOSPITAL_COMMUNITY): Payer: Commercial Managed Care - PPO

## 2015-06-27 LAB — COMPREHENSIVE METABOLIC PANEL
ALK PHOS: 95 U/L (ref 38–126)
ALT: 504 U/L — ABNORMAL HIGH (ref 17–63)
ANION GAP: 12 (ref 5–15)
AST: 561 U/L — ABNORMAL HIGH (ref 15–41)
Albumin: 2.5 g/dL — ABNORMAL LOW (ref 3.5–5.0)
BILIRUBIN TOTAL: 1.8 mg/dL — AB (ref 0.3–1.2)
BUN: 28 mg/dL — ABNORMAL HIGH (ref 6–20)
CALCIUM: 8.5 mg/dL — AB (ref 8.9–10.3)
CO2: 23 mmol/L (ref 22–32)
Chloride: 97 mmol/L — ABNORMAL LOW (ref 101–111)
Creatinine, Ser: 1.82 mg/dL — ABNORMAL HIGH (ref 0.61–1.24)
GFR calc Af Amer: 44 mL/min — ABNORMAL LOW (ref >60)
GFR, EST NON AFRICAN AMERICAN: 38 mL/min — AB (ref >60)
Glucose, Bld: 187 mg/dL — ABNORMAL HIGH (ref 65–99)
POTASSIUM: 4.7 mmol/L (ref 3.5–5.1)
Sodium: 132 mmol/L — ABNORMAL LOW (ref 135–145)
TOTAL PROTEIN: 6.3 g/dL — AB (ref 6.5–8.1)

## 2015-06-27 LAB — BLOOD GAS, ARTERIAL
ACID-BASE EXCESS: 1.8 mmol/L (ref 0.0–2.0)
Bicarbonate: 24.9 mEq/L — ABNORMAL HIGH (ref 20.0–24.0)
DRAWN BY: 441351
FIO2: 0.5
LHR: 28 {breaths}/min
MECHVT: 550 mL
O2 Saturation: 93.8 %
PEEP/CPAP: 5 cmH2O
PO2 ART: 76.9 mmHg — AB (ref 80.0–100.0)
Patient temperature: 102
TCO2: 26 mmol/L (ref 0–100)
pCO2 arterial: 36.1 mmHg (ref 35.0–45.0)
pH, Arterial: 7.463 — ABNORMAL HIGH (ref 7.350–7.450)

## 2015-06-27 LAB — CBC WITH DIFFERENTIAL/PLATELET
BASOS PCT: 0 %
Basophils Absolute: 0.1 10*3/uL (ref 0.0–0.1)
Eosinophils Absolute: 0 10*3/uL (ref 0.0–0.7)
Eosinophils Relative: 0 %
HEMATOCRIT: 45.1 % (ref 39.0–52.0)
Hemoglobin: 15.6 g/dL (ref 13.0–17.0)
LYMPHS ABS: 1.8 10*3/uL (ref 0.7–4.0)
LYMPHS PCT: 11 %
MCH: 31.1 pg (ref 26.0–34.0)
MCHC: 34.6 g/dL (ref 30.0–36.0)
MCV: 90 fL (ref 78.0–100.0)
MONO ABS: 1.3 10*3/uL — AB (ref 0.1–1.0)
MONOS PCT: 8 %
NEUTROS PCT: 81 %
Neutro Abs: 12.6 10*3/uL — ABNORMAL HIGH (ref 1.7–7.7)
Platelets: 130 10*3/uL — ABNORMAL LOW (ref 150–400)
RBC: 5.01 MIL/uL (ref 4.22–5.81)
RDW: 15.5 % (ref 11.5–15.5)
WBC: 15.8 10*3/uL — AB (ref 4.0–10.5)

## 2015-06-27 LAB — GLUCOSE, CAPILLARY
GLUCOSE-CAPILLARY: 124 mg/dL — AB (ref 65–99)
GLUCOSE-CAPILLARY: 139 mg/dL — AB (ref 65–99)
GLUCOSE-CAPILLARY: 153 mg/dL — AB (ref 65–99)
GLUCOSE-CAPILLARY: 163 mg/dL — AB (ref 65–99)
Glucose-Capillary: 122 mg/dL — ABNORMAL HIGH (ref 65–99)
Glucose-Capillary: 111 mg/dL — ABNORMAL HIGH (ref 65–99)

## 2015-06-27 LAB — HEPARIN LEVEL (UNFRACTIONATED)
HEPARIN UNFRACTIONATED: 0.37 [IU]/mL (ref 0.30–0.70)
Heparin Unfractionated: 0.31 IU/mL (ref 0.30–0.70)

## 2015-06-27 MED ORDER — ATROPINE SULFATE 0.1 MG/ML IJ SOLN
INTRAMUSCULAR | Status: AC
  Filled 2015-06-27: qty 30

## 2015-06-27 MED ORDER — EPINEPHRINE HCL 0.1 MG/ML IJ SOSY
PREFILLED_SYRINGE | INTRAMUSCULAR | Status: AC
  Filled 2015-06-27: qty 50

## 2015-06-27 MED ORDER — SODIUM CHLORIDE 0.9 % IV SOLN
3.0000 g | Freq: Four times a day (QID) | INTRAVENOUS | Status: DC
  Administered 2015-06-27 – 2015-06-28 (×4): 3 g via INTRAVENOUS
  Filled 2015-06-27 (×6): qty 3

## 2015-06-27 NOTE — Progress Notes (Signed)
ANTICOAGULATION CONSULT NOTE - Initial Consult  Pharmacy Consult for Heparin Indication: PE s/p tPA  Not on File  Patient Measurements: Height: 5\' 6"  (167.6 cm) Weight: 193 lb 9 oz (87.8 kg) IBW/kg (Calculated) : 63.8 Heparin Dosing Weight: 80 kg  Vital Signs: Temp: 102 F (38.9 C) (12/02 0900) BP: 145/57 mmHg (12/02 0858) Pulse Rate: 56 (12/02 0900)  Labs:  Recent Labs  01-29-2015 1700 01-29-2015 1902  01-29-2015 2222 01-29-2015 2330 06/26/15 0530  06/26/15 1013 06/26/15 1545 06/26/15 2036 06/26/15 2352 06/27/15 0500  HGB 16.9  --   --   --   --  16.4  --   --   --   --   --  15.6  HCT 51.0  --   --   --   --  49.4  --   --   --   --   --  45.1  PLT 118*  --   --   --   --  97*  --   --   --   --   --  130*  APTT  --  32  --  28  --   --   --   --   --   --   --   --   LABPROT  --  16.8*  --   --   --   --   --   --   --   --   --   --   INR  --  1.35  --   --   --   --   --   --   --   --   --   --   HEPARINUNFRC  --   --   --   --   --   --   < >  --  0.28*  --  0.37 0.31  CREATININE 2.96*  --   < >  --   --  2.52*  --  2.26*  --  1.62*  --  1.82*  TROPONINI 0.17*  --   --   --  0.27* 0.43*  --   --   --   --   --   --   < > = values in this interval not displayed.  Estimated Creatinine Clearance: 42.6 mL/min (by C-G formula based on Cr of 1.82).   Assessment: 64 y.o. male with cardiac arrest, likely PE s/p tPA, on heparin. Heparin level is on the low end of goal (HL= 0.31), Hg= 15.6 (stable), plt= 130 (up from 12/1). CT shows global anoxia and prognosis is very poor.  He is also on Unasyn for aspiration PNA. WBC= 15.8, tmax= 102.9, LA 4.3>>2.6, PCT= 1, SCr= 1.82 and CrCl ~ 40.  11/30 unasyn>>  11/30 urine- neg 11/30 resp- GPC in pairs/chains 11/30 blood  Goal of Therapy:  Heparin level 0.3-0.7 Monitor platelets by anticoagulation protocol: Yes   Plan:  -Increase heparin to 1400 units/hr -Change Unasyn to 3gm IV q6h -Will follow renal function, cultures and  clinical progress  Harland Germanndrew Emilio Baylock, Pharm D 06/27/2015 9:29 AM

## 2015-06-27 NOTE — Progress Notes (Signed)
Subjective:  Intubated, some mouth twitching and biting movements noted, otherwise not responsive.  Objective:  Vital Signs in the last 24 hours: Temp:  [99.9 F (37.7 C)-102.9 F (39.4 C)] 102 F (38.9 C) (12/02 0900) Pulse Rate:  [36-69] 56 (12/02 0900) Resp:  [0-28] 28 (12/02 0900) BP: (135-147)/(57-63) 145/57 mmHg (12/02 0858) SpO2:  [90 %-98 %] 96 % (12/02 0900) Arterial Line BP: (118-148)/(53-68) 148/62 mmHg (12/01 1500) FiO2 (%):  [40 %-50 %] 50 % (12/02 0858) Weight:  [193 lb 9 oz (87.8 kg)] 193 lb 9 oz (87.8 kg) (12/02 0500)  Intake/Output from previous day:  Intake/Output Summary (Last 24 hours) at 06/27/15 [id number] Last data filed at 06/27/15 0800  Gross per 24 hour  Intake 2949.45 ml  Output   1880 ml  Net 1069.45 ml    Physical Exam: General appearance: moderately obese and intubated, not responsive Lungs: clear Heart: irregularly irregular rhythm Extremities: no edema Neurologic: Not responsive   Rate: 90-110  Rhythm: NSR, RBB with varying PR interval and what appears to be AV disassociation with VR 55-60.   Lab Results:  Recent Labs  06/26/15 0530 06/27/15 0500  WBC 13.1* 15.8*  HGB 16.4 15.6  PLT 97* 130*    Recent Labs  06/26/15 2036 06/27/15 0500  NA 132* 132*  K 3.7 4.7  CL 98* 97*  CO2 23 23  GLUCOSE 117* 187*  BUN 28* 28*  CREATININE 1.62* 1.82*    Recent Labs  06/14/2015 2330 06/26/15 0530  TROPONINI 0.27* 0.43*    Recent Labs  06/13/2015 1902  INR 1.35    Scheduled Meds: . sodium chloride  250 mL Intravenous Once  . ampicillin-sulbactam (UNASYN) IV  3 g Intravenous Q6H  . antiseptic oral rinse  7 mL Mouth Rinse Q2H  . chlorhexidine  15 mL Mouth/Throat BID  . feeding supplement (PRO-STAT SUGAR FREE 64)  60 mL Per Tube TID  . feeding supplement (VITAL HIGH PROTEIN)  1,000 mL Per Tube Q24H  . insulin aspart  0-9 Units Subcutaneous 6 times per day  . pantoprazole (PROTONIX) IV  40 mg Intravenous QHS   Continuous  Infusions: . sodium chloride 10 mL/hr at 06/27/15 0800  . DOPamine 4 mcg/kg/min (06/27/15 0800)  . heparin 1,400 Units/hr (06/27/15 0800)  . isoproterenol (ISUPREL) infusion Stopped (06/26/15 1837)   PRN Meds:.sodium chloride, acetaminophen (TYLENOL) oral liquid 160 mg/5 mL, etomidate, midazolam, rocuronium   Imaging: Dg Chest Port 1 View  06/26/2015  CLINICAL DATA:  Respiratory failure. EXAM: PORTABLE CHEST 1 VIEW COMPARISON:  05/30/2015.  01/26/2015 .  01/22/2015. FINDINGS: Endotracheal tube and NG tube in stable position. Right hilar prominence noted. This could be from prominent vascularity. Right hilar mass cannot be excluded . Stable cardiomegaly. Improving right upper lobe infiltrate. Mild bilateral interstitial prominence. A component of congestive heart failure cannot be excluded . Low lung volumes with basilar atelectasis. Small right pleural effusion. No pneumothorax. IMPRESSION: 1. Lines and tubes in stable position. 2. Improving right upper lobe infiltrate. 3. Cardiomegaly with mild diffuse bilateral pulmonary interstitial prominence. A component of mild congestive heart failure may be present. Small right pleural effusion. 4. Right hilar fullness. Although this may be vascular a right hilar mass cannot be excluded. PA and lateral chest x-ray when the patient's clinically capable would be useful for further evaluation. Electronically Signed   By: Maisie Fushomas  Register   On: 06/26/2015 07:20     Cardiac Studies:  Echo 06/26/15 Study Conclusions  - Left  ventricle: Poorly visualized. The cavity size was normal. Wall thickness was increased in a pattern of moderate LVH. Difficult images even with Definity, estimate EF around 45-50%. Indeterminant diastolic function. Images were inadequate for LV wall motion assessment. - Ventricular septum: D-shaped septum suggests RV pressure/volume overload. - Aortic valve: There was no stenosis. - Mitral valve: Mildly calcified annulus.  There was no significant regurgitation. - Right ventricle: Poorly visualized. The cavity size was mildly dilated. Systolic function was moderately reduced. - Pulmonary arteries: No complete TR doppler jet so unable to estimate PA systolic pressure. - Systemic veins: IVC measured 2.0 cm with < 50% respirophasic variation, suggesting RA pressure 8 mmHg.  Impressions:  - Technically difficult study with very poor images. The patient appeared to be in heart block, possibly complete. Normal LV size with moderate LV hypertrophy. LV not well-visualized even with Definity, but suspect EF 45-50% range. The RV was poorly visualized but appeared abnormal, possibly mildly dilated with moderately decreased systolic function. D-shaped interventricular septum suggests RV pressure/volume overload.  Assessment/Plan:  64 y.o. male with unknown past medical history who presented to [facility] ED on 06/16/2015 following a cardiac arrest. He was found down by his co workers. No clear PMH, no records in [facility] or Care Everywhere though he was apparently on Amiodarone and Theophylline prior to admission. He was treated with TPA on admission for presumed pulmonary embolism- D-dimer 19, no CTA done.  Since admission he had some seizure activity noted and EEG done apparently confirmed seizure activity. He has had transient high grade AVB and AV disassociation, currently stable with VR 55-60 on Dopamine. Head CT c/w global anoxia. Pt is not responsive, on no sedation.    Principal Problem:   Out of hospital cardiac arrest [facility]) Active Problems:   Respiratory arrest (HCC)   Shock (HCC)   Complete heart block (HCC)   RBBB   Renal insufficiency-unknown duration   Seizure (HCC)   Anoxic brain damage (HCC)   PLAN: Staff attempting to get in touch with the pt's sister, prognosis guarded.   Corine Shelter PA-C 06/27/2015, 9:37 AM [phone]  Patient seen and examined and history reviewed.  Agree with above findings and plan. Patient remains without significant neurologic function. Brain death study in progress. BP stable on IV dopamine. More bradycardic this am with HR dropping from 50s to 35 with complete heart block. Prognosis is extremely poor. Sister located but does not plan to come to hospital today per nursing. I would not recommend pacing at this point with stable BP and grim prognosis.  Peter [place], MDFACC 06/27/2015 11:06 AM

## 2015-06-27 NOTE — Progress Notes (Signed)
Called to patients bedside for cardiac arrest in progress.  Initial rhythm PEA.  ROSC on arrival.  Patient bradycardic with BP in 150's systolic post arrest.  Case reviewed.  EEG findings reviewed.  Called sister, Andre [name] @ [phone] (cell) to update on patients status.  She reports she is the only member of his family that he has a relationship with.  He apparently has two daughters that he does not have a relationship with.  Given second arrest and EEG findings prior to arrest, further discussed code status with sister.  She agrees that further CPR would not be in his best interest.  Sister is not ready for withdrawal of support at this time.     Plan: Continue current supportive measures for now DNR Continue ongoing discussions regarding goals of care   CC Time:  40 minutes  Canary BrimBrandi Laquanda Bick, NP-C [facility] Pgr: [phone] or if no answer [phone] 06/27/2015, 4:34 PM

## 2015-06-27 NOTE — Progress Notes (Signed)
LTM EEG D/C'd per Dr Nandigam 

## 2015-06-27 NOTE — Progress Notes (Signed)
Subjective: Patient with severe anoxic brain injury secondary to cardiac arrest, currently intubated.   Current facility-administered medications:  .  0.9 %  sodium chloride infusion, 250 mL, Intravenous, PRN, Bernadene PersonKathryn A Whiteheart, NP .  0.9 %  sodium chloride infusion, 250 mL, Intravenous, Once, Nelda Bucksaniel J Feinstein, MD, Stopped at 06/27/15 0800 .  0.9 %  sodium chloride infusion, , Intravenous, Continuous, Nelda Bucksaniel J Feinstein, MD, Last Rate: 10 mL/hr at 06/27/15 0800 .  acetaminophen (TYLENOL) solution 650 mg, 650 mg, Per Tube, Q6H PRN, Zigmund GottronElizabeth C Deterding, MD, 650 mg at 06/27/15 1838 .  Ampicillin-Sulbactam (UNASYN) 3 g in sodium chloride 0.9 % 100 mL IVPB, 3 g, Intravenous, Q6H, Silvana Newnessndrew D Meyer, RPH, 3 g at 06/27/15 1400 .  antiseptic oral rinse solution (CORINZ), 7 mL, Mouth Rinse, Q2H, Nelda Bucksaniel J Feinstein, MD, 7 mL at 06/27/15 1839 .  chlorhexidine (PERIDEX) 0.12 % solution 15 mL, 15 mL, Mouth/Throat, BID, Nelda Bucksaniel J Feinstein, MD, 15 mL at 06/27/15 0933 .  DOPamine (INTROPIN) 800 mg in dextrose 5 % 250 mL (3.2 mg/mL) infusion, 5 mcg/kg/min, Intravenous, Continuous, Nelda Bucksaniel J Feinstein, MD, Stopped at 06/27/15 1500 .  etomidate (AMIDATE) injection, , Intravenous, PRN, Eber HongBrian Miller, MD, 10 mg at 06/09/2015 1618 .  feeding supplement (PRO-STAT SUGAR FREE 64) liquid 60 mL, 60 mL, Per Tube, TID, Arlyss GandyHeather C Pitts, RD, 60 mL at 06/27/15 1700 .  feeding supplement (VITAL HIGH PROTEIN) liquid 1,000 mL, 1,000 mL, Per Tube, Q24H, Arlyss GandyHeather C Pitts, RD, 1,000 mL at 06/26/15 1513 .  heparin ADULT infusion 100 units/mL (25000 units/250 mL), 1,500 Units/hr, Intravenous, Continuous, Silvana Newnessndrew D Meyer, [facility], Last Rate: 15 mL/hr at 06/27/15 1404, 1,500 Units/hr at 06/27/15 1404 .  insulin aspart (novoLOG) injection 0-9 Units, 0-9 Units, Subcutaneous, 6 times per day, Nelda Bucksaniel J Feinstein, MD, 1 Units at 06/27/15 1205 .  isoproterenol (ISUPREL) 1 mg in dextrose 5 % 250 mL (0.004 mg/mL) infusion, 2-20 mcg/min, Intravenous,  Titrated, Leeann MustJacob Kelly, MD, Stopped at 06/26/15 1837 .  midazolam (VERSED) injection 2-4 mg, 2-4 mg, Intravenous, Q1H PRN, Zigmund GottronElizabeth C Deterding, MD, 2 mg at 06/26/15 2157 .  pantoprazole (PROTONIX) injection 40 mg, 40 mg, Intravenous, QHS, Bernadene PersonKathryn A Whiteheart, NP, 40 mg at 06/26/15 2131 .  rocuronium (ZEMURON) injection, , Intravenous, PRN, Eber HongBrian Miller, MD, 100 mg at 06/04/2015 1618  Exam: Filed Vitals:   06/27/15 1800 06/27/15 1900  BP:    Pulse: 32 30  Temp: 104.7 F (40.4 C) 105.1 F (40.6 C)  Resp: 17 25    Neurological examination Patient intubated, very minimal sluggishly reactive pupils bilaterally, absent corneals, absent gag, no motor response to tactile simulation.     Impression:   Patient with severe anoxic brain injury secondary to cardiac arrest. He has absent corneal's gag reflex, with no motor response to stimulus. He has minimally reactive pupils bilaterally. He is not brain dead based on this clinical evaluation.  ECS EEG was done today which showed minimal bifrontal background activity with severely reduced amplitudes. Long-term EEG overnight showed suppressed background, no discernible burst activity or seizures noted.   Overall, this is suggestive for severe anoxic brain injury with invariably very poor prognosis .   Discussed case with ICU attending Dr. Tyson AliasFeinstein.

## 2015-06-27 NOTE — Code Documentation (Signed)
  Patient Name: Andre [name]   MRN: [id number]   Date of Birth/ Sex: 1950/12/17 , male      Admission Date: 05/31/2015  Attending Provider: Nelda Bucksaniel J Feinstein, MD  Primary Diagnosis: Cardiac arrest [facility](HCC)   Indication: Pt was in his usual state of health until this PM, when he was noted to be in PEA. Code blue was subsequently called. At the time of arrival on scene, ACLS protocol was underway.   Technical Description:  - CPR performance duration:  15  minutes  - Was defibrillation or cardioversion used? No   - Was external pacer placed? No  - Was patient intubated pre/post CPR? Yes   Medications Administered: Y = Yes; Blank = No Amiodarone    Atropine  Y  Calcium    Epinephrine  Y  Lidocaine    Magnesium    Norepinephrine    Phenylephrine    Sodium bicarbonate    Vasopressin     Post CPR evaluation:  - Final Status - Was patient successfully resuscitated ? Yes - What is current rhythm? Afib - What is current hemodynamic status? Stable  Miscellaneous Information:  - Labs sent, including: N/A  - Primary team notified?  Yes  - Family Notified? Yes  - Additional notes/ transfer status:      Darrick HuntsmanWilliam R Jackson Coffield, MD  06/27/2015, 4:00 PM

## 2015-06-27 NOTE — Progress Notes (Addendum)
RT called to pt's room due to PEA. CPR started. Rt bagged pt on 15 L 100%.

## 2015-06-27 NOTE — Progress Notes (Signed)
ANTICOAGULATION CONSULT NOTE - Follow Up Consult  Pharmacy Consult for heparin Indication: pulmonary embolus  Labs:  Recent Labs  2014-10-28 1645 2014-10-28 1700 2014-10-28 1902  2014-10-28 2222 2014-10-28 2330 06/26/15 0530 06/26/15 0645 06/26/15 1013 06/26/15 1545 06/26/15 2036 06/26/15 2352  HGB 18.7* 16.9  --   --   --   --  16.4  --   --   --   --   --   HCT 55.0* 51.0  --   --   --   --  49.4  --   --   --   --   --   PLT  --  118*  --   --   --   --  97*  --   --   --   --   --   APTT  --   --  32  --  28  --   --   --   --   --   --   --   LABPROT  --   --  16.8*  --   --   --   --   --   --   --   --   --   INR  --   --  1.35  --   --   --   --   --   --   --   --   --   HEPARINUNFRC  --   --   --   --   --   --   --  0.15*  --  0.28*  --  0.37  CREATININE 2.70* 2.96*  --   < >  --   --  2.52*  --  2.26*  --  1.62*  --   TROPONINI  --  0.17*  --   --   --  0.27* 0.43*  --   --   --   --   --   < > = values in this interval not displayed.   Assessment/Plan:  64yo male therapeutic on heparin after rate increase. Will continue gtt at current rate and confirm stable with am labs.   Vernard GamblesVeronda Dominie Benedick, PharmD, BCPS  06/27/2015,12:46 AM

## 2015-06-27 NOTE — Progress Notes (Signed)
ECS protocol EEG complete

## 2015-06-27 NOTE — H&P (Signed)
PULMONARY / CRITICAL CARE MEDICINE   Name: Andre Andre [name] MRN: [id number] DOB: [birth year]    ADMISSION DATE:  06/20/2015 CONSULTATION DATE:  05/30/2015  CHIEF COMPLAINT: Andre Heck, MD  HISTORY OF PRESENT ILLNESS:   64 yr old male limited history available. 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 Found at work , asystole, new RBBB, tpa empiric  SUBJECTIVE: int myoclonus tongue  Andre [name] SIGNS: BP 145/57 mmHg  Pulse 56  Temp(Src) 102 F (38.9 C) (Core (Comment))  Resp 28  Ht 5' 6"  (1.676 m)  Wt 87.8 kg (193 lb 9 oz)  BMI 31.26 kg/m2  SpO2 96%  HEMODYNAMICS:    VENTILATOR SETTINGS: Vent Mode:  [-] PRVC FiO2 (%):  [40 %-50 %] 50 % Set Rate:  [28 bmp] 28 bmp Vt Set:  [550 mL] 550 mL PEEP:  [5 cmH20] 5 cmH20 Plateau Pressure:  [22 cmH20-29 cmH20] 26 cmH20  INTAKE / OUTPUT: I/O last 3 completed shifts: In: 4438.1 [I.V.:2822.4; NG/GT:540.7; IV Piggyback:1075] Out: 2960 [Urine:2560; Emesis/NG output:200; Other:200]  PHYSICAL EXAMINATION: General:  No movement to pain, collar Neuro:  perrl 2m, tongue movements, no cough, no gag, nocorneals HEENT:  jvd noted, collar Cardiovascular:  s1 s2 distant, HR improved 63 Lungs: CTA Abdomen:  Soft, BS hypo, nt, nd Musculoskeletal:  No hematoma, no bruising Skin:  No rash  LABS:  CBC  Recent Labs Lab 06/07/2015 1700 06/26/15 0530 06/27/15 0500  WBC 9.6 13.1* 15.8*  HGB 16.9 16.4 15.6  HCT 51.0 49.4 45.1  PLT 118* 97* 130*   Coag's  Recent Labs Lab 06/10/2015 1902 05/31/2015 2222  APTT 32 28  INR 1.35  --    BMET  Recent Labs Lab 06/26/15 1013 06/26/15 2036 06/27/15 0500  NA 131* 132* 132*  K 3.5 3.7 4.7  CL 98* 98* 97*  CO2 24 23 23   BUN 30* 28* 28*  CREATININE 2.26* 1.62* 1.82*  GLUCOSE 208* 117* 187*   Electrolytes  Recent Labs Lab 06/05/2015 1830  06/26/15 0530 06/26/15 1013 06/26/15 2036 06/27/15 0500  CALCIUM  --   < > 8.5* 8.3* 8.4* 8.5*  MG 2.3  --  1.7  --   --   --   PHOS 6.1*   --  1.9*  --   --   --   < > = values in this interval not displayed. Sepsis Markers  Recent Labs Lab 06/01/2015 1646 06/09/2015 1830 06/06/2015 1902  LATICACIDVEN 4.35*  --  2.6*  PROCALCITON  --  1.04  --    ABG  Recent Labs Lab 06/22/2015 2355 06/26/15 0400 06/27/15 0340  PHART 7.296* 7.308* 7.463*  PCO2ART 69.6* 49.8* 36.1  PO2ART 183.0* 248* 76.9*   Liver Enzymes  Recent Labs Lab 06/18/2015 1700 06/26/15 1013 06/27/15 0500  AST 714* 640* 561*  ALT 316* 433* 504*  ALKPHOS 120  --  95  BILITOT 1.2  --  1.8*  ALBUMIN 3.1*  --  2.5*   Cardiac Enzymes  Recent Labs Lab 06/23/2015 1700 05/28/2015 2330 06/26/15 0530  TROPONINI 0.17* 0.27* 0.43*   Glucose  Recent Labs Lab 06/26/15 1233 06/26/15 1546 06/26/15 2008 06/26/15 2354 06/27/15 0352 06/27/15 0737  GLUCAP 210* 228* 91 124* 153* 122*    Imaging Dg Chest Port 1 View  06/27/2015  CLINICAL DATA:  Status post lateral Hospital cardiac arrest, respiratory rest, shock, anoxic brain injury. EXAM: PORTABLE CHEST 1 VIEW COMPARISON:  Portable chest x-ray of June 26, 2015 FINDINGS: The lungs are adequately inflated.  The interstitial markings remain increased. The cardiac silhouette remains enlarged. The pulmonary vascularity is slightly less prominent today. There is no pneumothorax nor significant pleural effusion. The endotracheal tube tip lies 4.4 cm above the carina. External pacemaker defibrillator pads are present. The esophagogastric tube tip projects below the inferior margin of the image. IMPRESSION: Slight interval decrease in pulmonary interstitial edema. Stable enlargement of cardiac silhouette with central pulmonary vascular congestion. Stable subsegmental atelectasis at the right lung base. The support tubes are in reasonable position. Electronically Signed   By: David  Martinique M.D.   On: 06/27/2015 07:24   Ct Portable Head W/o Cm  06/26/2015  CLINICAL DATA:  Status post CPR with unresponsiveness. EXAM: CT HEAD  WITHOUT CONTRAST TECHNIQUE: Contiguous axial images were obtained from the base of the skull through the vertex without intravenous contrast. COMPARISON:  December 30, 2013 FINDINGS: The fourth ventricle appears relatively effaced. The lateral ventricles appear slightly smaller than on prior study but are visualized in the midline. Third ventricle is also in the midline without change from prior study. There is relative loss of gray - white differentiation diffusely compared to prior study, a finding concerning for global anoxic injury. There is no mass, hemorrhage, extra-axial fluid collection, or midline shift. Compared to the previous study, there is relative increased attenuation in both middle cerebral arteries, a finding concerning for sluggish flow in these vessels. The bony calvarium appears intact. The mastoid air cells are clear. No intraorbital lesions are identified. IMPRESSION: Relative loss of gray - white differentiation diffusely with increased attenuation in both middle cerebral arteries compared to prior study. These are findings concerning for a degree of global anoxia. A well-defined focal infarct is not seen. There is no hemorrhage or mass. The fourth ventricle appear somewhat effaced, a finding that may be seen with surrounding edema. The lateral and third ventricles remain apparent and in the midline. There is still visualization of sulci in the supratentorial regions bilaterally, although several sulci appear relatively effaced compared to prior study. Electronically Signed   By: Lowella Grip III M.D.   On: 06/26/2015 12:57     STUDIES:  Echo 11/30 LImited echo>>>poor rv and lf fxn, rv dilated and hypokinetic, no sig effusion, poor images at times on vent CT head 12/1>>>Relative loss of gray - white differentiation diffusely with increased attenuation in both middle cerebral arteries compared to prior study. These are findings concerning for a degree of global anoxia.    CULTURES: Sputum 11/30>>> Urine 12/1>>> 12/1 BC>>>  ANTIBIOTICS: unasyn 11/30>>>  SIGNIFICANT EVENTS: 11/30 unwitnessed arrest, brady, HB in ed 12/1- horrible neuro exam, CT head and EEG 12/2- eeg flat  LINES/TUBES: 11/30 ETT>>> 11/30 rt fem a line>>> 11/30 rt fem cvl>>>   ASSESSMENT / PLAN:  PULMONARY A: Acute resp failure, r/o PE, uncompendated acidosis P:   ABg reviewed, alk noted, reduce rate If exam progresses to brain death will consider apnea and super Oxygenation prior and follow up abg  CARDIOVASCULAR A:  S/p Arrest, RBBB new?, r/o PE, Heart Block P:  Doppler legs neg pre lim isopril changed secondary to HTn affect frmo that drug, dopamine, reduction to MAP 60 Cortisol 36, no role stress roids  RENAL A:   ARF, ATN P:   Chem in am  MAP support  GASTROINTESTINAL A:   Likely shock liver P:   LFT in am  Tf to goal ppi  HEMATOLOGIC A:   Polycythemia (r/o hypovolemia, chronic hypoxia?), r/o dvt, PE, s/p tpa, slight drop  plat, no bleeding noted P:  Heparin transition per pharmacy - presumed PE  INFECTIOUS A:   Concern aspiration event, pct 1.0, central fevers likely P:   11/30 unasyn  11/30 sputum 11/30 BC>>>  Consider 5 days unasyn  ENDOCRINE A:   Hyperglycemia, r/o DM P:   ssi  NEUROLOGIC A:   S/p arrest, Poor prognosis, horrible prognosis P:   RASS goal: 0 Versed int for myoclonus  eeg nogtd but NOT brain dead on exam likley poor outcome, Calling sister to decide for comfort  FAMILY  - Updates: I looked for family. 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 Titus Mould, MD, FACP Pgr: [place] [facility]  Pulmonary and [place] Pager: [phone]  06/27/2015, 9:46 AM

## 2015-06-28 DIAGNOSIS — Z515 Encounter for palliative care: Secondary | ICD-10-CM

## 2015-06-28 DIAGNOSIS — J9621 Acute and chronic respiratory failure with hypoxia: Secondary | ICD-10-CM | POA: Insufficient documentation

## 2015-06-28 LAB — GLUCOSE, CAPILLARY
Glucose-Capillary: 129 mg/dL — ABNORMAL HIGH (ref 65–99)
Glucose-Capillary: 143 mg/dL — ABNORMAL HIGH (ref 65–99)
Glucose-Capillary: 152 mg/dL — ABNORMAL HIGH (ref 65–99)

## 2015-06-28 LAB — BASIC METABOLIC PANEL
ANION GAP: 9 (ref 5–15)
BUN: 49 mg/dL — AB (ref 6–20)
CHLORIDE: 103 mmol/L (ref 101–111)
CO2: 25 mmol/L (ref 22–32)
Calcium: 7.9 mg/dL — ABNORMAL LOW (ref 8.9–10.3)
Creatinine, Ser: 2 mg/dL — ABNORMAL HIGH (ref 0.61–1.24)
GFR calc Af Amer: 39 mL/min — ABNORMAL LOW (ref >60)
GFR, EST NON AFRICAN AMERICAN: 34 mL/min — AB (ref >60)
GLUCOSE: 158 mg/dL — AB (ref 65–99)
POTASSIUM: 4.2 mmol/L (ref 3.5–5.1)
Sodium: 137 mmol/L (ref 135–145)

## 2015-06-28 LAB — CBC
HEMATOCRIT: 43.1 % (ref 39.0–52.0)
Hemoglobin: 13.8 g/dL (ref 13.0–17.0)
MCH: 30 pg (ref 26.0–34.0)
MCHC: 32 g/dL (ref 30.0–36.0)
MCV: 93.7 fL (ref 78.0–100.0)
PLATELETS: 132 10*3/uL — AB (ref 150–400)
RBC: 4.6 MIL/uL (ref 4.22–5.81)
RDW: 16.2 % — ABNORMAL HIGH (ref 11.5–15.5)
WBC: 17.8 10*3/uL — AB (ref 4.0–10.5)

## 2015-06-28 LAB — CULTURE, RESPIRATORY W GRAM STAIN

## 2015-06-28 LAB — CULTURE, RESPIRATORY

## 2015-06-28 LAB — HEPARIN LEVEL (UNFRACTIONATED): Heparin Unfractionated: 0.52 IU/mL (ref 0.30–0.70)

## 2015-06-28 MED ORDER — MORPHINE BOLUS VIA INFUSION
5.0000 mg | INTRAVENOUS | Status: DC | PRN
  Administered 2015-06-28 (×2): 5 mg via INTRAVENOUS
  Filled 2015-06-28 (×3): qty 20

## 2015-06-28 MED ORDER — DEXTROSE 5 % IV SOLN
10.0000 mg/h | INTRAVENOUS | Status: DC
  Administered 2015-06-28: 5 mg/h via INTRAVENOUS
  Filled 2015-06-28 (×2): qty 10

## 2015-06-28 MED FILL — Medication: Qty: 1 | Status: AC

## 2015-06-30 ENCOUNTER — Telehealth: Payer: Self-pay

## 2015-06-30 LAB — CULTURE, BLOOD (ROUTINE X 2)
CULTURE: NO GROWTH
CULTURE: NO GROWTH

## 2015-06-30 NOTE — Telephone Encounter (Signed)
On 06/30/2015 I received a death certificate from [facility] (faxed). The death certificate is for cremation. The patient is a patient of Designer, television/film setDoctor Nestor. The death certificate will be taken to the [place] (61M) this pm for signature. On 07/01/2015 I received a death certificate from Doctor [employer]. I got the death certificate ready and called the funeral home to let them know I was faxing the death certificate over per their request.

## 2015-07-02 ENCOUNTER — Telehealth: Payer: Self-pay

## 2015-07-02 NOTE — Telephone Encounter (Signed)
On 07/02/2015 I received a [facility] certificate from [facility] (original). The death certificate is for cremation. The patient is a patient of Designer, television/film setDoctor Nestor. The death certificate will be taken to [employer] this pm for signature. On 07/03/2015 I received the death certificate back from Doctor GardinerNestor. I got the death certificate ready and called the funeral home to let them know the [facility]

## 2015-07-27 NOTE — Discharge Summary (Signed)
Death Note: For a complete accounting of the patient's history & physical exam on presentation please refer to the H&P dictated on 06/13/2015.  Patient was found down on the ground without any associated trauma at work. He was in asystole with a new right bundle branch block. Empiric TPA was administered for presumed pulmonary embolus. The patient's CT scan of the head on 06/26/15 revealed loss of gray-white matter differentiation consistent with global anoxia. A lengthy family discussion was held given the patient's dismal prognosis for a neurological recovery with his daughters, ex-wife, and sister at bedside on 12-May-2015.  They agreed the patient would not wish to be maintained in this current state and requested to transition to full comfort care with a terminal ventilator wean & extubation.  Canary BrimBrandi Ollis, NP was called to bedside on 07/23/2015 after his extubation and was found to have passed being pronounced by her at 13:08 PM on 06/26/2015.  Diagnoses at Death: 1. Anoxic Brain Injury 2. Acute Renal Failure 3. Aspiration Pneumonia 4. Shock Liver 5. Acute Hypoxic Respiratory Failure 6. Asystolic Cardiac Arrest 7. New Right Bundle Branch Block

## 2015-07-27 NOTE — Procedures (Signed)
History:  65 year old male patient, status post cardiac arrest.   Current facility-administered medications:  .  0.9 %  sodium chloride infusion, 250 mL, Intravenous, PRN, Bernadene PersonKathryn A Whiteheart, NP .  0.9 %  sodium chloride infusion, 250 mL, Intravenous, Once, Nelda Bucksaniel J Feinstein, MD, Last Rate: 10 mL/hr at 07/13/2015 0700, 250 mL at 07/26/2015 0700 .  0.9 %  sodium chloride infusion, , Intravenous, Continuous, Nelda Bucksaniel J Feinstein, MD, Last Rate: 10 mL/hr at 07/06/2015 0700 .  acetaminophen (TYLENOL) solution 650 mg, 650 mg, Per Tube, Q6H PRN, Zigmund GottronElizabeth C Deterding, MD, 650 mg at 06/27/2015 0809 .  antiseptic oral rinse solution (CORINZ), 7 mL, Mouth Rinse, Q2H, Nelda Bucksaniel J Feinstein, MD, 7 mL at 06/26/2015 0739 .  chlorhexidine (PERIDEX) 0.12 % solution 15 mL, 15 mL, Mouth/Throat, BID, Nelda Bucksaniel J Feinstein, MD, 15 mL at 06/27/15 2009 .  midazolam (VERSED) injection 2-4 mg, 2-4 mg, Intravenous, Q1H PRN, Zigmund GottronElizabeth C Deterding, MD, 2 mg at 06/26/15 2157  Introduction:  This is a 19 channel ECS scalp EEG performed at the bedside with bipolar and monopolar montages arranged in accordance to the ECS protocol, of electrode placement. One channel was dedicated to EKG recording.    Findings:  The background is severely suppressed. Minimal bifrontal (left greater than right) cerebral activity with a  frequency of less than 1 Hz is noted.  Muscle artifacts were noted. No definite evidence of abnormal epileptiform discharges or electrographic seizures were noted during this recording.   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 Clinical correlation is recommended .

## 2015-07-27 NOTE — Progress Notes (Signed)
PULMONARY / CRITICAL CARE MEDICINE   Name: Andre [name] MRN: [id number] DOB: 25-Sep-1950    ADMISSION DATE:  06/01/2015 CONSULTATION DATE:  06/09/2015  CHIEF COMPLAINT: Hyacinth Meeker, MD  HISTORY OF PRESENT ILLNESS:   65 yr old male limited history available. 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 Found at work , asystole, new RBBB, tpa empiric  STUDIES:  Echo 11/30 LImited echo>>>poor rv and lf fxn, rv dilated and hypokinetic, no sig effusion, poor images at times on vent CT head 12/1>>>Relative loss of gray - white differentiation diffusely with increased attenuation in both middle cerebral arteries compared to prior study. These are findings concerning for a degree of global anoxia.   CULTURES: Sputum 11/30>>> Urine 12/1>>> Blood Ctx 11/30>>>  ANTIBIOTICS: Unasyn 11/30>>>  SIGNIFICANT EVENTS: 11/30 unwitnessed arrest, brady, HB in ed 12/1- horrible neuro exam, CT head and EEG 12/2- eeg flat  LINES/TUBES: 11/30 ETT>>> 11/30 rt fem a line>>> 11/30 rt fem cvl>>>  SUBJECTIVE: No events overnight. Family at bedside this morning.  REVIEW OF SYSTEMS:  Unobtainable given anoxic brain injury.  VITAL SIGNS: BP 147/49 mmHg  Pulse 50  Temp(Src) 104.7 F (40.4 C) (Core (Comment))  Resp 29  Ht  (1.676 m)  Wt 190 lb 7.6 oz (86.4 kg)  BMI 30.76 kg/m2  SpO2 96%  HEMODYNAMICS:    VENTILATOR SETTINGS: Vent Mode:  [-] PRVC FiO2 (%):  [50 %-100 %] 60 % Set Rate:  [18 bmp] 18 bmp Vt Set:  [550 mL] 550 mL PEEP:  [5 cmH20] 5 cmH20 Plateau Pressure:  [24 cmH20-29 cmH20] 25 cmH20  INTAKE / OUTPUT: I/O last 3 completed shifts: In: 3342 [I.V.:1177; Other:900; NG/GT:965; IV Piggyback:300] Out: 2150 [Urine:2150]  PHYSICAL EXAMINATION: General:  No movement. C-collar in place. Family at bedside. Neuro:  No pupil reflex on my exam. No withdrawal to pain. HEENT:  C-collar in place. ETT in place. Cardiovascular:  Regular rate. Unable to appreciate JVD given  collar.  Lungs: Coarse breath sounds bilaterally. Symmetric chest wall rise. Abdomen:  Soft. Nondistended. Normal bowel sounds. Skin:  Warm & dry. No rash on exposed skin.  LABS:  CBC  Recent Labs Lab 06/26/15 0530 06/27/15 0500 07-05-2015 0420  WBC 13.1* 15.8* 17.8*  HGB 16.4 15.6 13.8  HCT 49.4 45.1 43.1  PLT 97* 130* 132*   Coag's  Recent Labs Lab 06/02/2015 1902 06/19/2015 2222  APTT 32 28  INR 1.35  --    BMET  Recent Labs Lab 06/26/15 2036 06/27/15 0500 2015/07/05 0420  NA 132* 132* 137  K 3.7 4.7 4.2  CL 98* 97* 103  CO2 BUN 28* 28* 49*  CREATININE 1.62* 1.82* 2.00*  GLUCOSE 117* 187* 158*   Electrolytes  Recent Labs Lab 06/10/2015 1830  06/26/15 0530  06/26/15 2036 06/27/15 0500 July 05, 2015 0420  CALCIUM  --   < > 8.5*  < > 8.4* 8.5* 7.9*  MG 2.3  --  1.7  --   --   --   --   PHOS 6.1*  --  1.9*  --   --   --   --   < > = values in this interval not displayed. Sepsis Markers  Recent Labs Lab 06/03/2015 1646 05/28/2015 1830 06/01/2015 1902  LATICACIDVEN 4.35*  --  2.6*  PROCALCITON  --  1.04  --    ABG  Recent Labs Lab 06/18/2015 2355 06/26/15 0400 06/27/15 0340  PHART 7.296* 7.308* 7.463*  PCO2ART 69.6* 49.8*  36.1  PO2ART 183.0* 248* 76.9*   Liver Enzymes  Recent Labs Lab 03/26/15 1700 06/26/15 1013 06/27/15 0500  AST 714* 640* 561*  ALT 316* 433* 504*  ALKPHOS 120  --  95  BILITOT 1.2  --  1.8*  ALBUMIN 3.1*  --  2.5*   Cardiac Enzymes  Recent Labs Lab 03/26/15 1700 03/26/15 2330 06/26/15 0530  TROPONINI 0.17* 0.27* 0.43*   Glucose  Recent Labs Lab 06/27/15 1200 06/27/15 1704 06/27/15 1955 06/27/15 2327 06/30/2015 0320 07/08/2015 0750  GLUCAP 139* 111* 129* 163* 143* 152*    Imaging No results found.   ASSESSMENT / PLAN:  65 y.o. Male s/p arrest from probable PE s/p TPA on a heparin drip. Patient has severe anoxic brain injury. His prognosis is extremely poor. I had a lengthy family discussion with his  daughters, ex-wife, and sister at bedside this morning along with Neurology. The patient expressed the desire to live a very independent life and would not wish to be maintained in his current manner.  Given that they have all agreed to withdrawal of care and terminal extubation.   1. Palliative care: Transitioning to a morphine drip for for palliation. Plan for terminal extubation & consult a hospital chaplain services. Patient will be DO NOT RESUSCITATE/DO NOT INTUBATE. Discontinuing all unnecessary medications & labs. 2. Anoxic brain injury: Versed IV when necessary for myoclonus. 3.  acute hypoxic respiratory failure: Morphine drip for relief of any potential dyspnea with terminal extubation.   I have spent a total of 40 minutes of critical care time this morning caring for the patient and in discussion with the patient's family members at bedside regarding goals of care and planning for full palliation as we terminally extubate the patient.  Donna ChristenJennings E. Jamison NeighborNestor, M.D. [facility] Pager:  [phone] After 3pm or if no response, call [phone] 07/03/2015, 9:46 AM

## 2015-07-27 NOTE — Progress Notes (Signed)
Met with pt.'s family to offer spiritual support. The family is doing well under the circumstances. I prayed with them, and told them I would check back later. They were very appreciative and did not need any further assistance at this time.

## 2015-07-27 NOTE — Progress Notes (Signed)
Morphine 250 mL wasted in sink by Devota PaceKaylee Dequante Tremaine RN and Hillery AldoShanna Stowe RN.

## 2015-07-27 NOTE — Progress Notes (Signed)
       Patient Name: Michaela CornerJimmy Hornbaker Date of Encounter: 06/30/2015    SUBJECTIVE: Unable to obtain history as patient is comatose. Chart is reviewed.  TELEMETRY:  AV block with heart rate in the 60s. Filed Vitals:   04/22/15 0700 04/22/15 0752 04/22/15 0800 04/22/15 0826  BP:  149/52  147/49  Pulse: 47 51 50 50  Temp: 104.4 F (40.2 C) 104.7 F (40.4 C) 104.7 F (40.4 C)   TempSrc:  Core (Comment) Core (Comment)   Resp: 27 24 20 29   Height:      Weight:      SpO2: 96% 96% 95% 96%    Intake/Output Summary (Last 24 hours) at 04/22/15 1009 Last data filed at 04/22/15 0800  Gross per 24 hour  Intake 2600.6 ml  Output   1405 ml  Net 1195.6 ml   LABS: Basic Metabolic Panel:  Recent Labs  78/29/5611/10/2014 1830  06/26/15 0530  06/27/15 0500 04/22/15 0420  NA  --   < > 132*  < > 132* 137  K  --   < > 3.4*  < > 4.7 4.2  CL  --   < > 98*  < > 97* 103  CO2  --   < > 23  < > 23 25  GLUCOSE  --   < > 208*  < > 187* 158*  BUN  --   < > 32*  < > 28* 49*  CREATININE  --   < > 2.52*  < > 1.82* 2.00*  CALCIUM  --   < > 8.5*  < > 8.5* 7.9*  MG 2.3  --  1.7  --   --   --   PHOS 6.1*  --  1.9*  --   --   --   < > = values in this interval not displayed. CBC:  Recent Labs  06/13/2015 1700  06/27/15 0500 04/22/15 0420  WBC 9.6  < > 15.8* 17.8*  NEUTROABS 7.2  --  12.6*  --   HGB 16.9  < > 15.6 13.8  HCT 51.0  < > 45.1 43.1  MCV 94.3  < > 90.0 93.7  PLT 118*  < > 130* 132*  < > = values in this interval not displayed. Cardiac Enzymes:  Recent Labs  06/11/2015 1700 06/25/15 2330 06/26/15 0530  TROPONINI 0.17* 0.27* 0.43*     Radiology/Studies:  No new data. Interval decrease in interstitial edema  Physical Exam: Blood pressure 147/49, pulse 50, temperature 104.7 F (40.4 C), temperature source Core (Comment), resp. rate 29, height 5\' 6"  (1.676 m), weight 190 lb 7.6 oz (86.4 kg), SpO2 96 %. Weight change: -3 lb 1.4 oz (-1.4 kg)  Wt Readings from Last 3 Encounters:  04/22/15  190 lb 7.6 oz (86.4 kg)   Family is at bedside. Patient is unresponsive. Monitor demonstrates AV block with a junctional rhythm at 60 bpm  ASSESSMENT:  1. Out of hospital cardiac arrest, exact etiology uncertain. Small enzyme leak but no pattern to suggest large acute infarct. 2. Mild reduction in systolic blood pressure with EF of 40% 3. Neurologic injury and poor prognosis  Plan:  No change in current cardiac recommendations.  Windy FastSigned, Adelise Buswell III,Silvie Obremski W 07/04/2015, 10:09 AM

## 2015-07-27 NOTE — Procedures (Signed)
Extubation Procedure Note  Patient Details:   Name: Andre [name] DOB: 1951/01/12 MRN: [id number]   Airway Documentation:  Airway 8 mm (Active)  Secured at (cm) 25 cm 07/02/2015  8:26 AM  Measured From Lips 07/18/2015  8:26 AM  Secured Location Center 07/11/2015  8:26 AM  Secured By [employer] Tube Holder 07/04/2015  8:26 AM  Tube Holder Repositioned Yes 07/04/2015  8:26 AM  Cuff Pressure (cm H2O) 26 cm H2O 07/13/2015 12:19 AM  Site Condition Dry 07/20/2015  8:26 AM    Evaluation  O2 sats: currently acceptable Complications: No apparent complications Patient did tolerate procedure well. Bilateral Breath Sounds: Rhonchi, Diminished Suctioning: Airway No   Withdrawal of Life Sustaining Treatment    [name] 07/17/2015, 11:13 AM

## 2015-07-27 NOTE — Progress Notes (Signed)
Called to patients bedside to pronounce death - no heart tones auscultated, no spontaneous respirations.  Time of Death 1308 PM.    Andre BrimBrandi Maurizio Geno, NP-C [facility] Pgr: [phone] or if no answer [phone] 07/09/2015, 1:21 PM

## 2015-07-27 NOTE — Progress Notes (Signed)
Subjective: Patient with severe anoxic brain injury secondary to cardiac arrest, currently intubated. Family at bediside.   Current facility-administered medications:  .  0.9 %  sodium chloride infusion, 250 mL, Intravenous, PRN, Bernadene PersonKathryn A Whiteheart, NP .  0.9 %  sodium chloride infusion, 250 mL, Intravenous, Once, Nelda Bucksaniel J Feinstein, MD, Last Rate: 10 mL/hr at Sep 20, 2014 0700, 250 mL at Sep 20, 2014 0700 .  0.9 %  sodium chloride infusion, , Intravenous, Continuous, Nelda Bucksaniel J Feinstein, MD, Last Rate: 10 mL/hr at Sep 20, 2014 0700 .  acetaminophen (TYLENOL) solution 650 mg, 650 mg, Per Tube, Q6H PRN, Zigmund GottronElizabeth C Deterding, MD, 650 mg at Sep 20, 2014 0809 .  antiseptic oral rinse solution (CORINZ), 7 mL, Mouth Rinse, Q2H, Nelda Bucksaniel J Feinstein, MD, 7 mL at Sep 20, 2014 0739 .  chlorhexidine (PERIDEX) 0.12 % solution 15 mL, 15 mL, Mouth/Throat, BID, Nelda Bucksaniel J Feinstein, MD, 15 mL at 06/27/15 2009 .  midazolam (VERSED) injection 2-4 mg, 2-4 mg, Intravenous, Q1H PRN, Zigmund GottronElizabeth C Deterding, MD, 2 mg at 06/26/15 2157  Exam: Filed Vitals:   Sep 20, 2014 0800 Sep 20, 2014 0826  BP:  147/49  Pulse: 50 50  Temp: 104.7 F (40.4 C)   Resp: 20 29    Neurological examination Patient intubated,  non reactive pupils bilaterally, absent corneals, absent gag, no motor response to tactile simulation.     Impression:   Patient with severe anoxic brain injury secondary to cardiac arrest. He has absent pupils, corneal's or gag reflex, with no motor response to stimulus.  Discussed with family. They are considering withdrawal of care.   Discussed case with ICU attending Dr. Burnis MedinNestler

## 2015-07-27 NOTE — Progress Notes (Addendum)
ANTICOAGULATION CONSULT NOTE - Initial Consult  Pharmacy Consult for Heparin Indication: PE s/p tPA  Not on File  Patient Measurements: Height: 5\' 6"  (167.6 cm) Weight: 190 lb 7.6 oz (86.4 kg) IBW/kg (Calculated) : 63.8 Heparin Dosing Weight: 80 kg  Vital Signs: Temp: 104.7 F (40.4 C) (12/03 0800) Temp Source: Core (Comment) (12/03 0800) BP: 147/49 mmHg (12/03 0826) Pulse Rate: 50 (12/03 0826)  Labs:  Recent Labs  11-19-2014 1700 11-19-2014 1902  11-19-2014 2222 11-19-2014 2330 06/26/15 0530  06/26/15 2036 06/26/15 2352 06/27/15 0500 07/16/2015 0420 07/24/2015 0421  HGB 16.9  --   --   --   --  16.4  --   --   --  15.6 13.8  --   HCT 51.0  --   --   --   --  49.4  --   --   --  45.1 43.1  --   PLT 118*  --   --   --   --  97*  --   --   --  130* 132*  --   APTT  --  32  --  28  --   --   --   --   --   --   --   --   LABPROT  --  16.8*  --   --   --   --   --   --   --   --   --   --   INR  --  1.35  --   --   --   --   --   --   --   --   --   --   HEPARINUNFRC  --   --   --   --   --   --   < >  --  0.37 0.31  --  0.52  CREATININE 2.96*  --   < >  --   --  2.52*  < > 1.62*  --  1.82* 2.00*  --   TROPONINI 0.17*  --   --   --  0.27* 0.43*  --   --   --   --   --   --   < > = values in this interval not displayed.  Estimated Creatinine Clearance: 38.4 mL/min (by C-G formula based on Cr of 2).   Assessment: 65 y.o. male with cardiac arrest, likely PE s/p tPA, on heparin. Heparin level remains therapeutic at 0.52 after rate increase yesterday morning. H/H with slow trend down but wnl, plt trend back up to 132. No significant s/s bleeding reported. CT shows global anoxia and prognosis is very poor.  Goal of Therapy:  Heparin level 0.3-0.7 Monitor platelets by anticoagulation protocol: Yes   Plan:  - Continue heparin gtt at 1500 units/hr - Daily HL/CBC - F/u GOC plans  Caesar Mannella K. Bonnye FavaNicolsen, PharmD, BCPS, CPP Clinical Pharmacist Pager: [phone] Phone:  [phone] 06/26/2015 9:02 AM

## 2015-07-27 DEATH — deceased

## 2016-07-26 IMAGING — CR DG CHEST 1V PORT
1 series · 1 of 1 positions shown · non-contrast
Comparison: Prior today

CLINICAL DATA: Central line placement. Cardiac arrest. On
ventilator.

EXAM:
PORTABLE CHEST 1 VIEW

[AP]
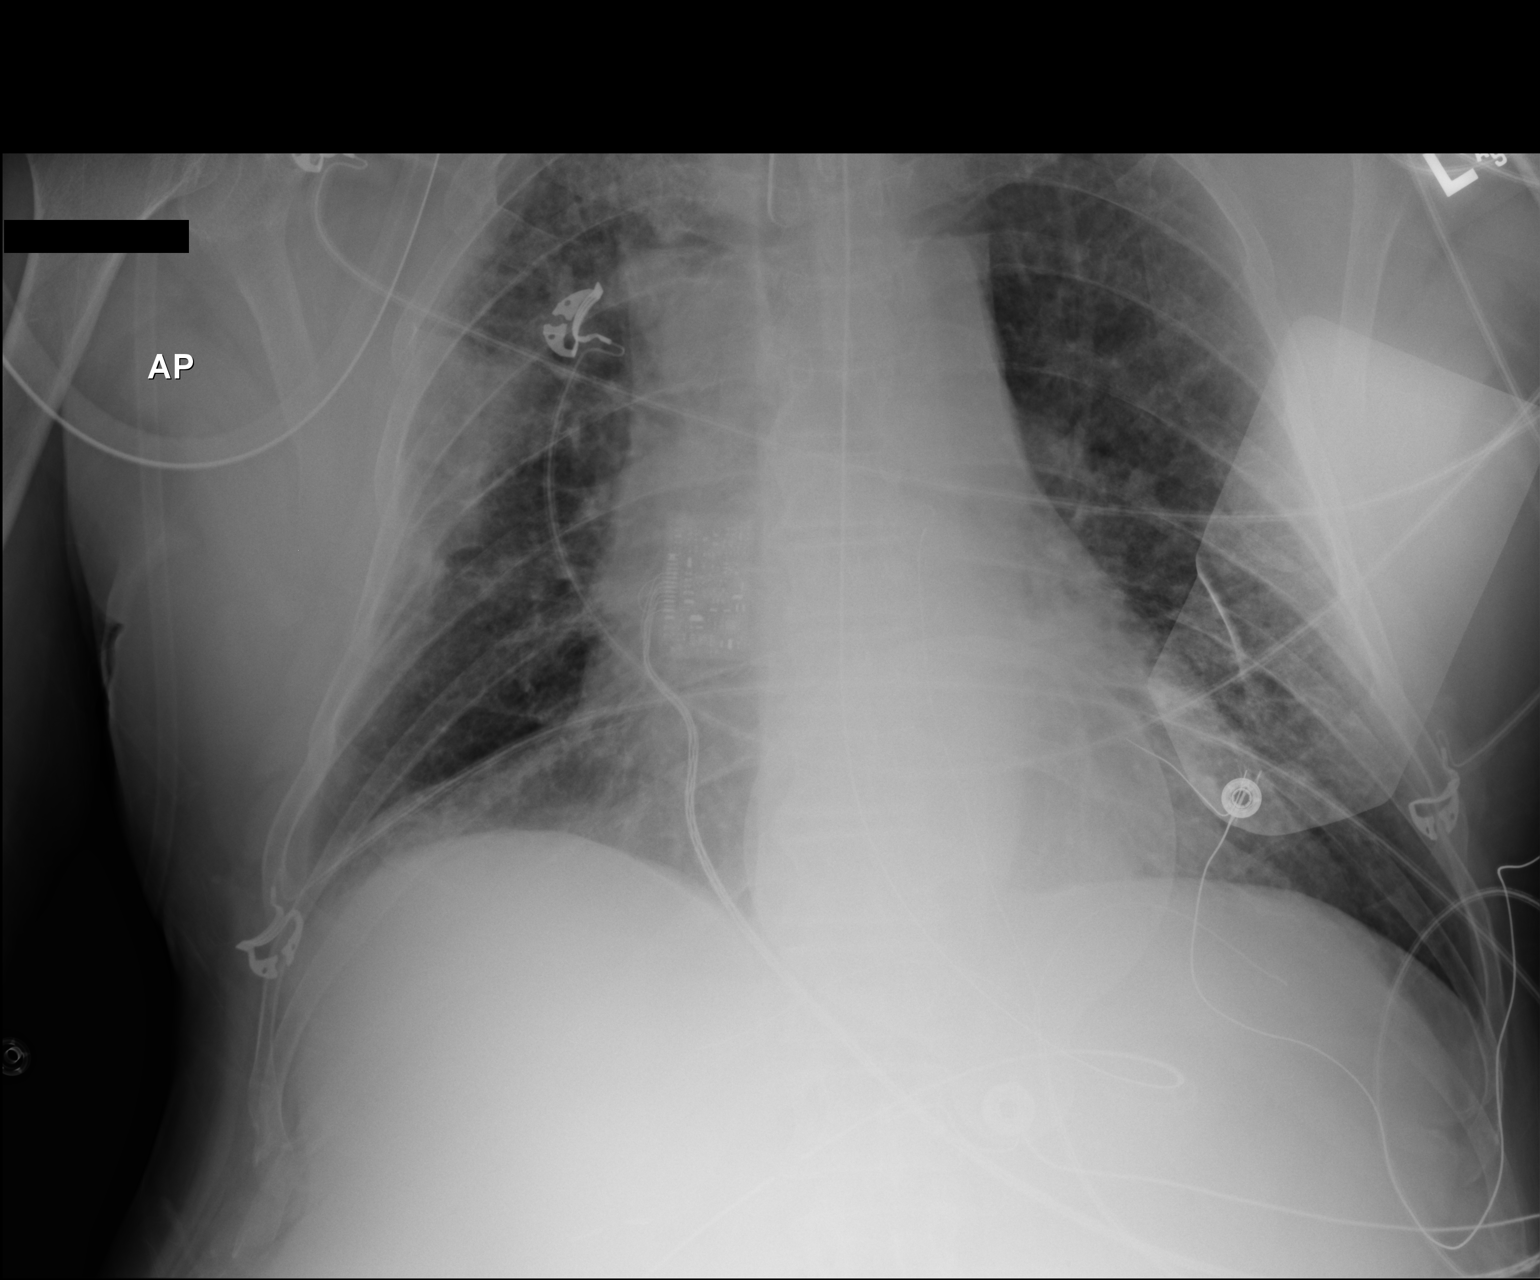

[1 of 1 positions shown; findings below may reference images not displayed]

FINDINGS: Endotracheal tube is been pulled back with tip now approximately
cm above carina. Nasogastric tube remains in place with tip in
distal stomach.

Airspace opacity is seen in the peripheral right upper lobe which is
increased since previous study. Left lung remains clear. Heart size
is stable. No pneumothorax visualized.
IMPRESSION: Endotracheal tube in appropriate position.

Increased airspace opacity in peripheral right upper lobe.

## 2016-07-26 IMAGING — CR DG CHEST 1V PORT
1 series · 1 of 1 positions shown · non-contrast
Comparison: Chest x-ray dated 02/19/2015.

CLINICAL DATA: Post CPR.

EXAM:
PORTABLE CHEST 1 VIEW

[AP]
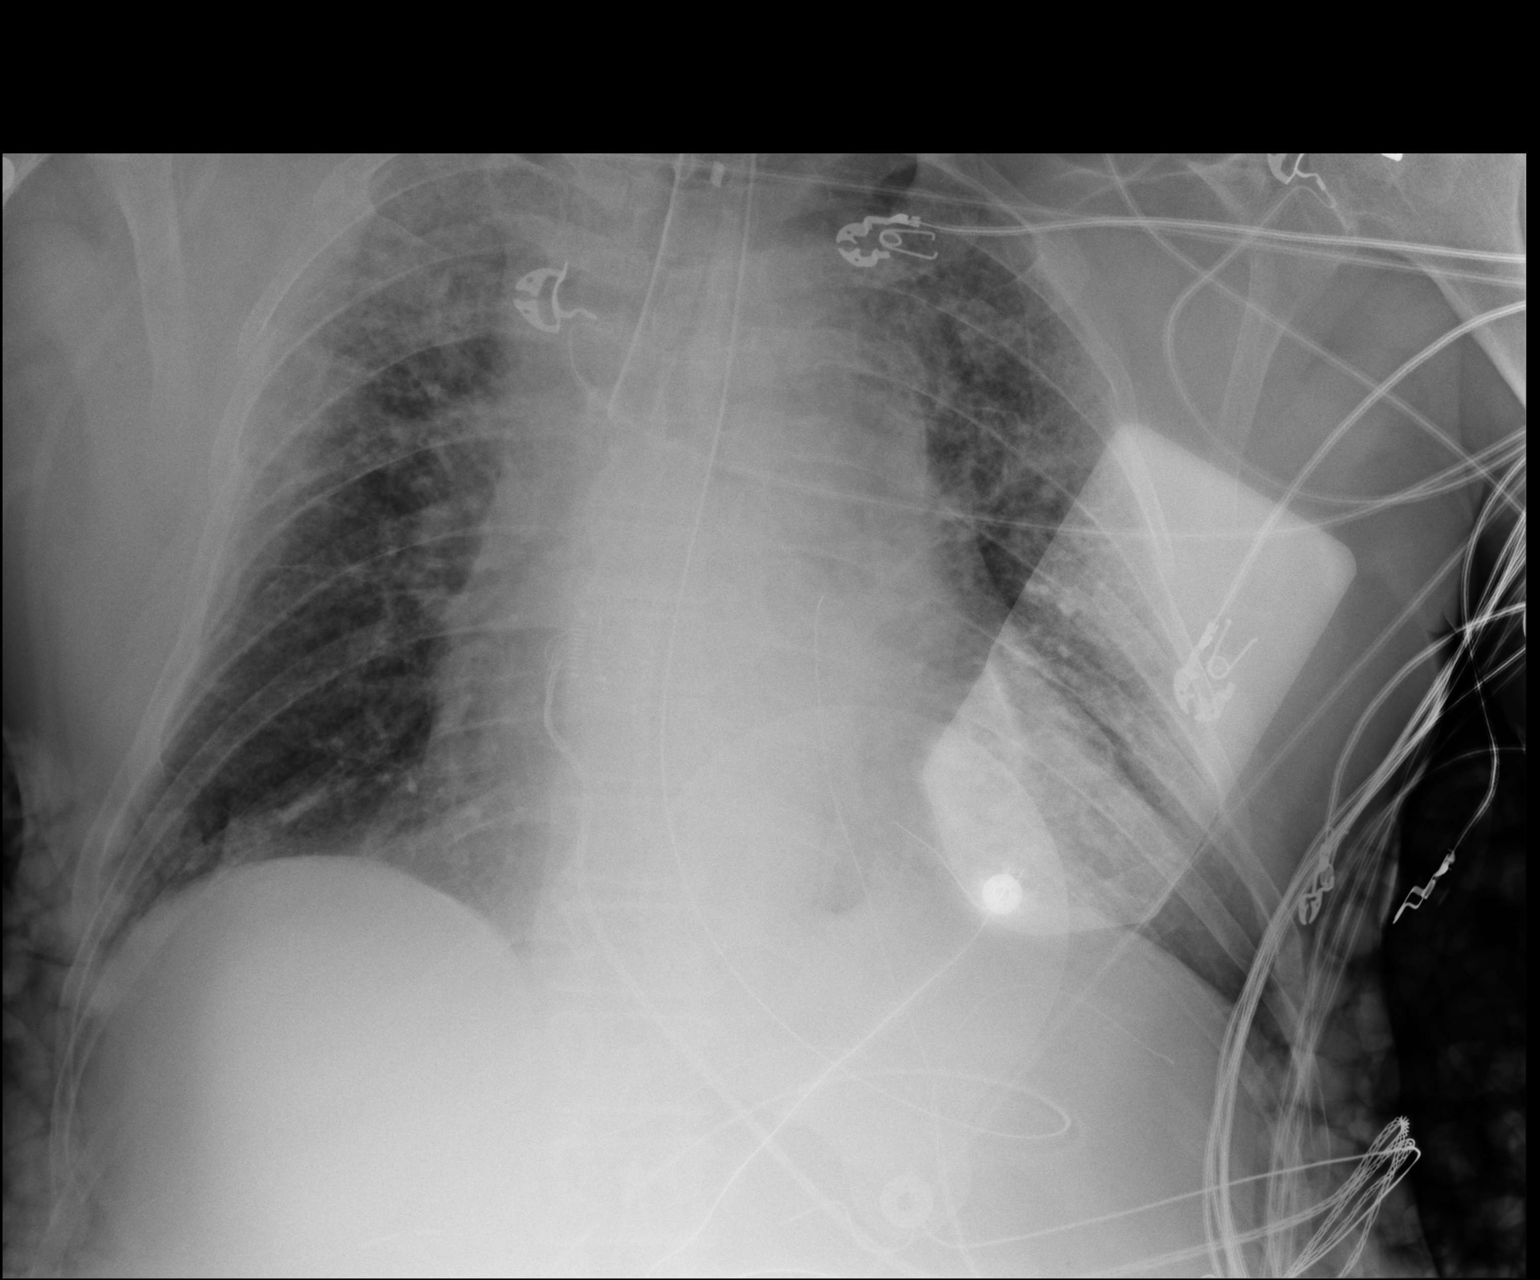

[1 of 1 positions shown; findings below may reference images not displayed]

FINDINGS: The endotracheal tube tip is slightly into the right mainstem
bronchus. Cardiomediastinal silhouette is stable in size given the
supine patient positioning.

Patchy airspace opacities are seen bilaterally, upper lobe
predominant, most likely edema. No pleural effusions seen. No
pneumothorax seen. No osseous fracture or dislocation seen.

Pacer pads overlie the left heart shadow. Enteric tube passes below
the diaphragm.
IMPRESSION: 1. Endotracheal tube tip slightly into the right mainstem bronchus.
Recommend retracting 2-3 cm.
2. Patchy airspace opacities bilaterally, most likely edema.

These results were called by telephone at the time of interpretation
on 06/25/2015 at [DATE] to Dr. TEMISTOCLE YAO , who verbally
acknowledged these results.

## 2016-07-27 IMAGING — CT CT HEAD W/O CM
1 of 2 series · 13 of 30 positions shown, 17 images · non-contrast
Comparison: December 30, 2013

CLINICAL DATA: Status post CPR with unresponsiveness.

EXAM:
CT HEAD WITHOUT CONTRAST
TECHNIQUE: Contiguous axial images were obtained from the base of the skull
through the vertex without intravenous contrast.

[Series 1: — · axial · 0.49mm/px · z∈[-418,-288]mm · 13 of 32 slices shown, 17 images]
[im 3/32  brain]
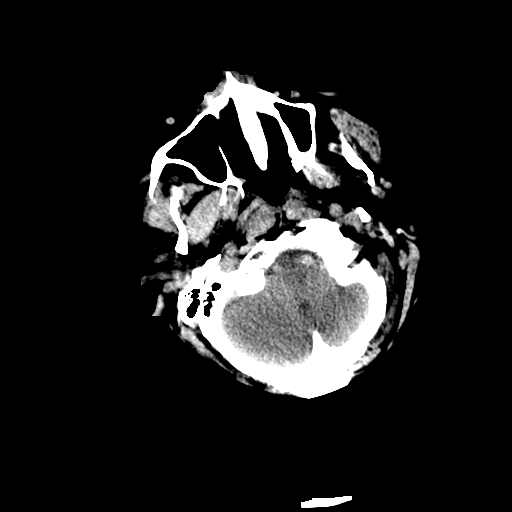
[im 3/32  bone]
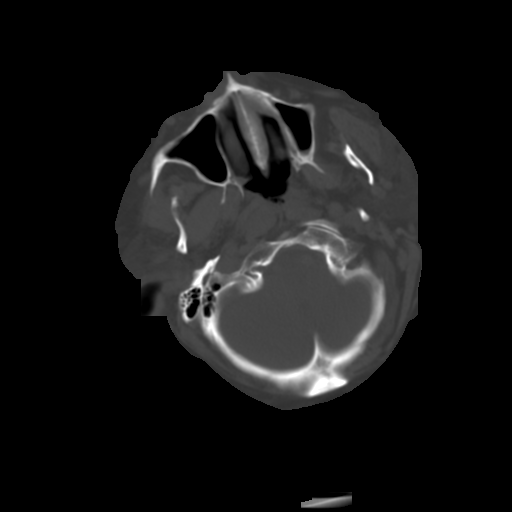
[im 5/32  brain]
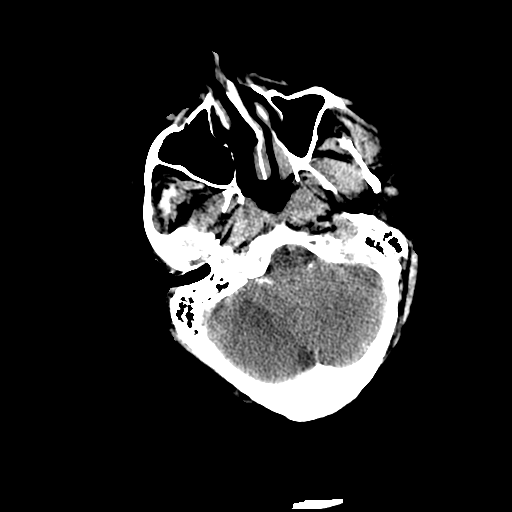
[im 7/32  brain]
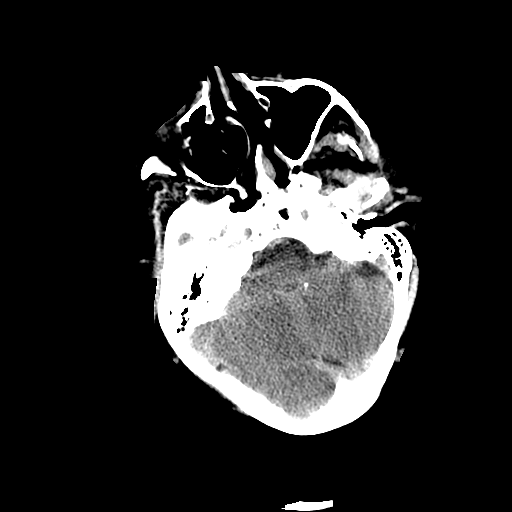
[im 9/32  brain]
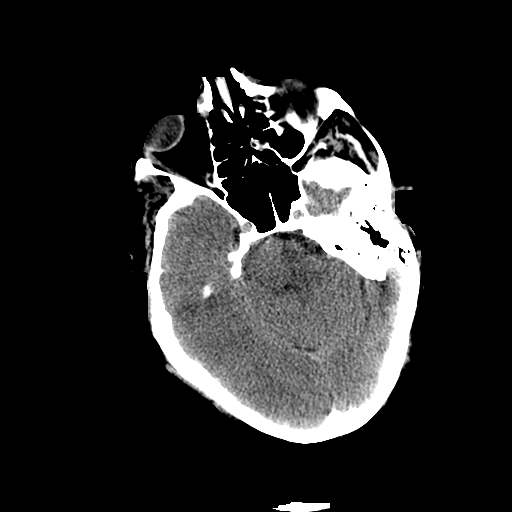
[im 12/32  brain]
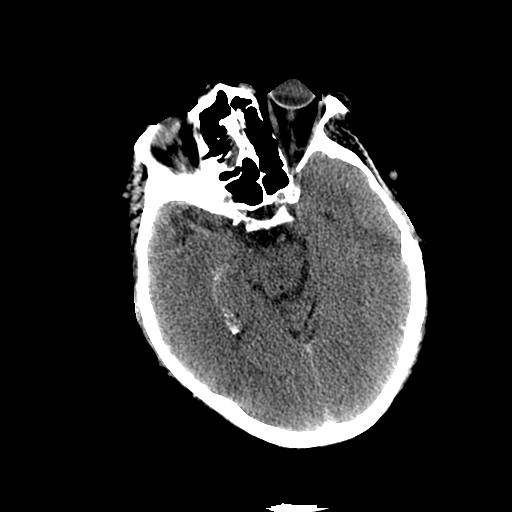
[im 12/32  bone]
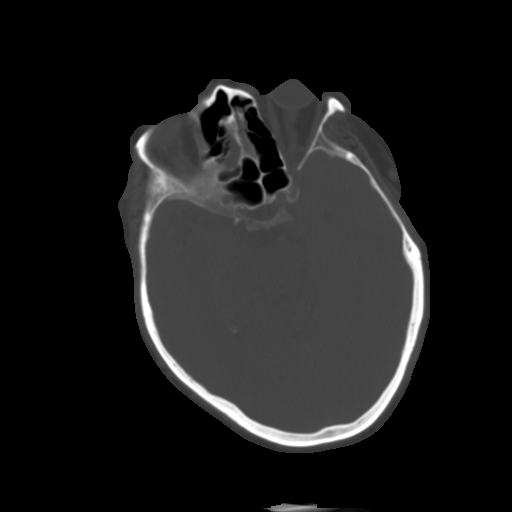
[im 14/32  brain]
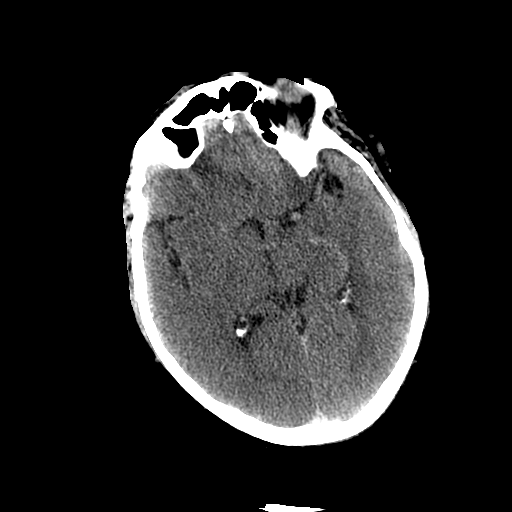
[im 16/32  brain]
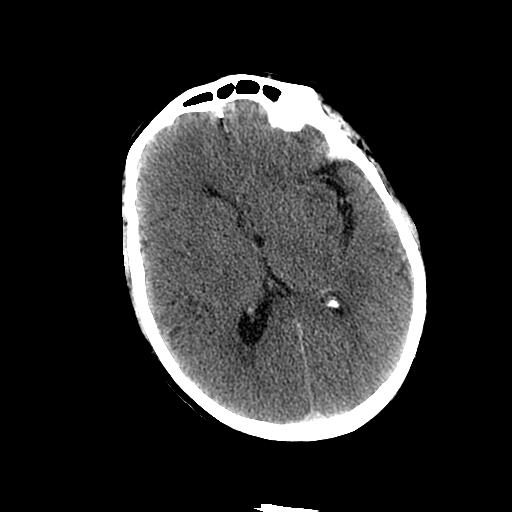
[im 18/32  brain]
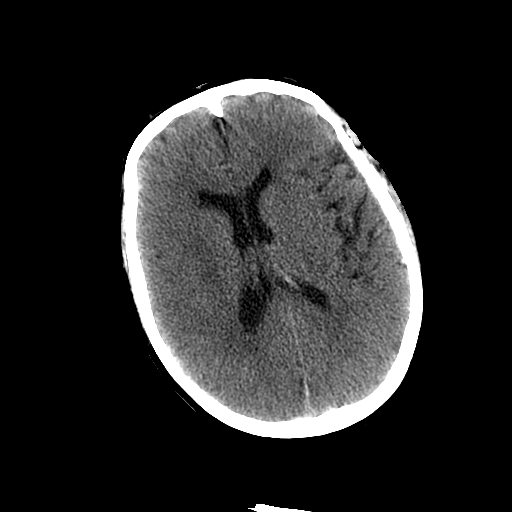
[im 20/32  brain]
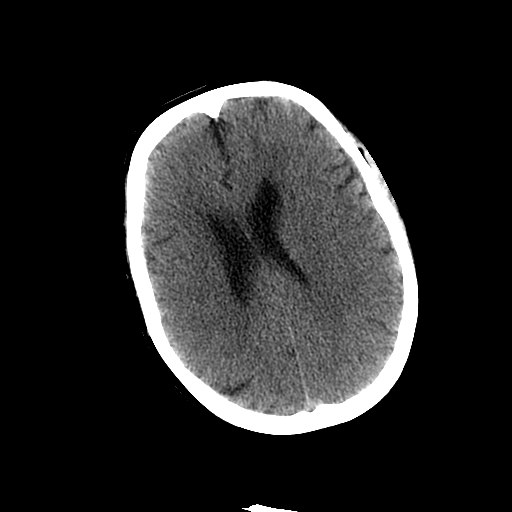
[im 20/32  bone]
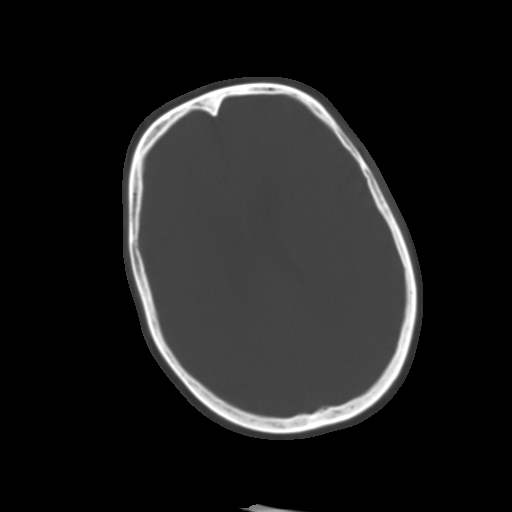
[im 23/32  brain]
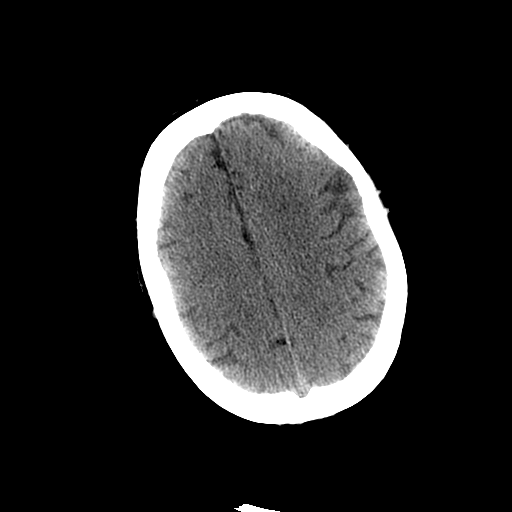
[im 25/32  brain]
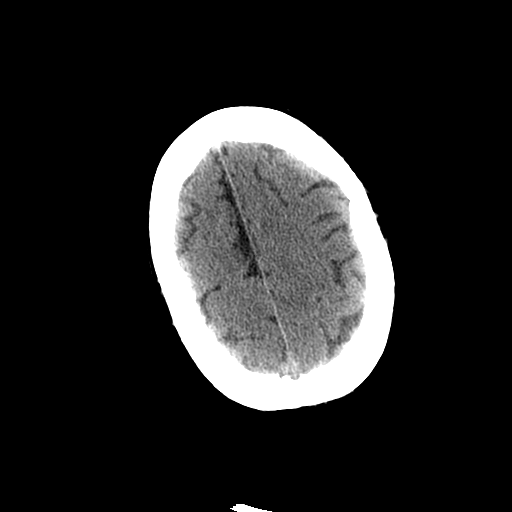
[im 27/32  brain]
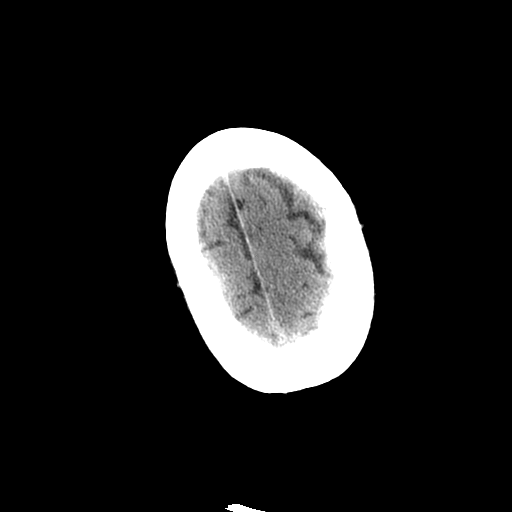
[im 29/32  brain]
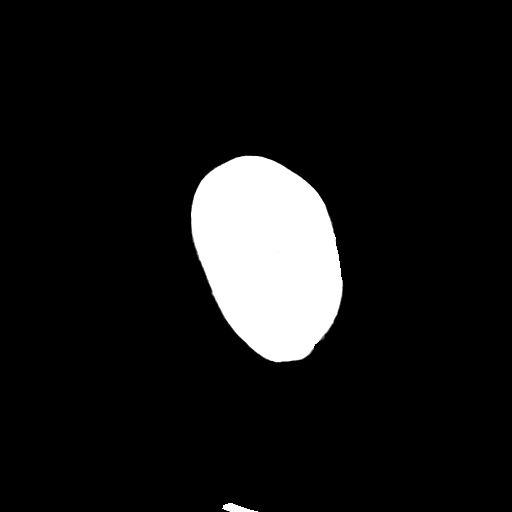
[im 29/32  bone]
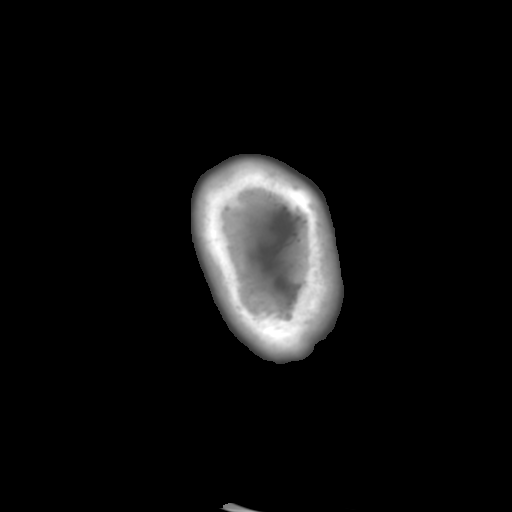

[13 of 30 positions shown; findings below may reference images not displayed]

FINDINGS: The fourth ventricle appears relatively effaced. The lateral
ventricles appear slightly smaller than on prior study but are
visualized in the midline. Third ventricle is also in the midline
without change from prior study. There is relative loss of gray -
white differentiation diffusely compared to prior study, a finding
concerning for global anoxic injury. There is no mass, hemorrhage,
extra-axial fluid collection, or midline shift. Compared to the
previous study, there is relative increased attenuation in both
middle cerebral arteries, a finding concerning for sluggish flow in
these vessels.

The bony calvarium appears intact. The mastoid air cells are clear.
No intraorbital lesions are identified.
IMPRESSION: Relative loss of gray - white differentiation diffusely with
increased attenuation in both middle cerebral arteries compared to
prior study. These are findings concerning for a degree of global
anoxia. A well-defined focal infarct is not seen. There is no
hemorrhage or mass. The fourth ventricle appear somewhat effaced, a
finding that may be seen with surrounding edema. The lateral and
third ventricles remain apparent and in the midline. There is still
visualization of sulci in the supratentorial regions bilaterally,
although several sulci appear relatively effaced compared to prior
study.

## 2016-07-28 IMAGING — CR DG CHEST 1V PORT
1 series · 1 of 1 positions shown · non-contrast
Comparison: Portable chest x-ray June 26, 2015

CLINICAL DATA: Status post lateral Hospital cardiac arrest,
respiratory rest, shock, anoxic brain injury.

EXAM:
PORTABLE CHEST 1 VIEW

[AP]
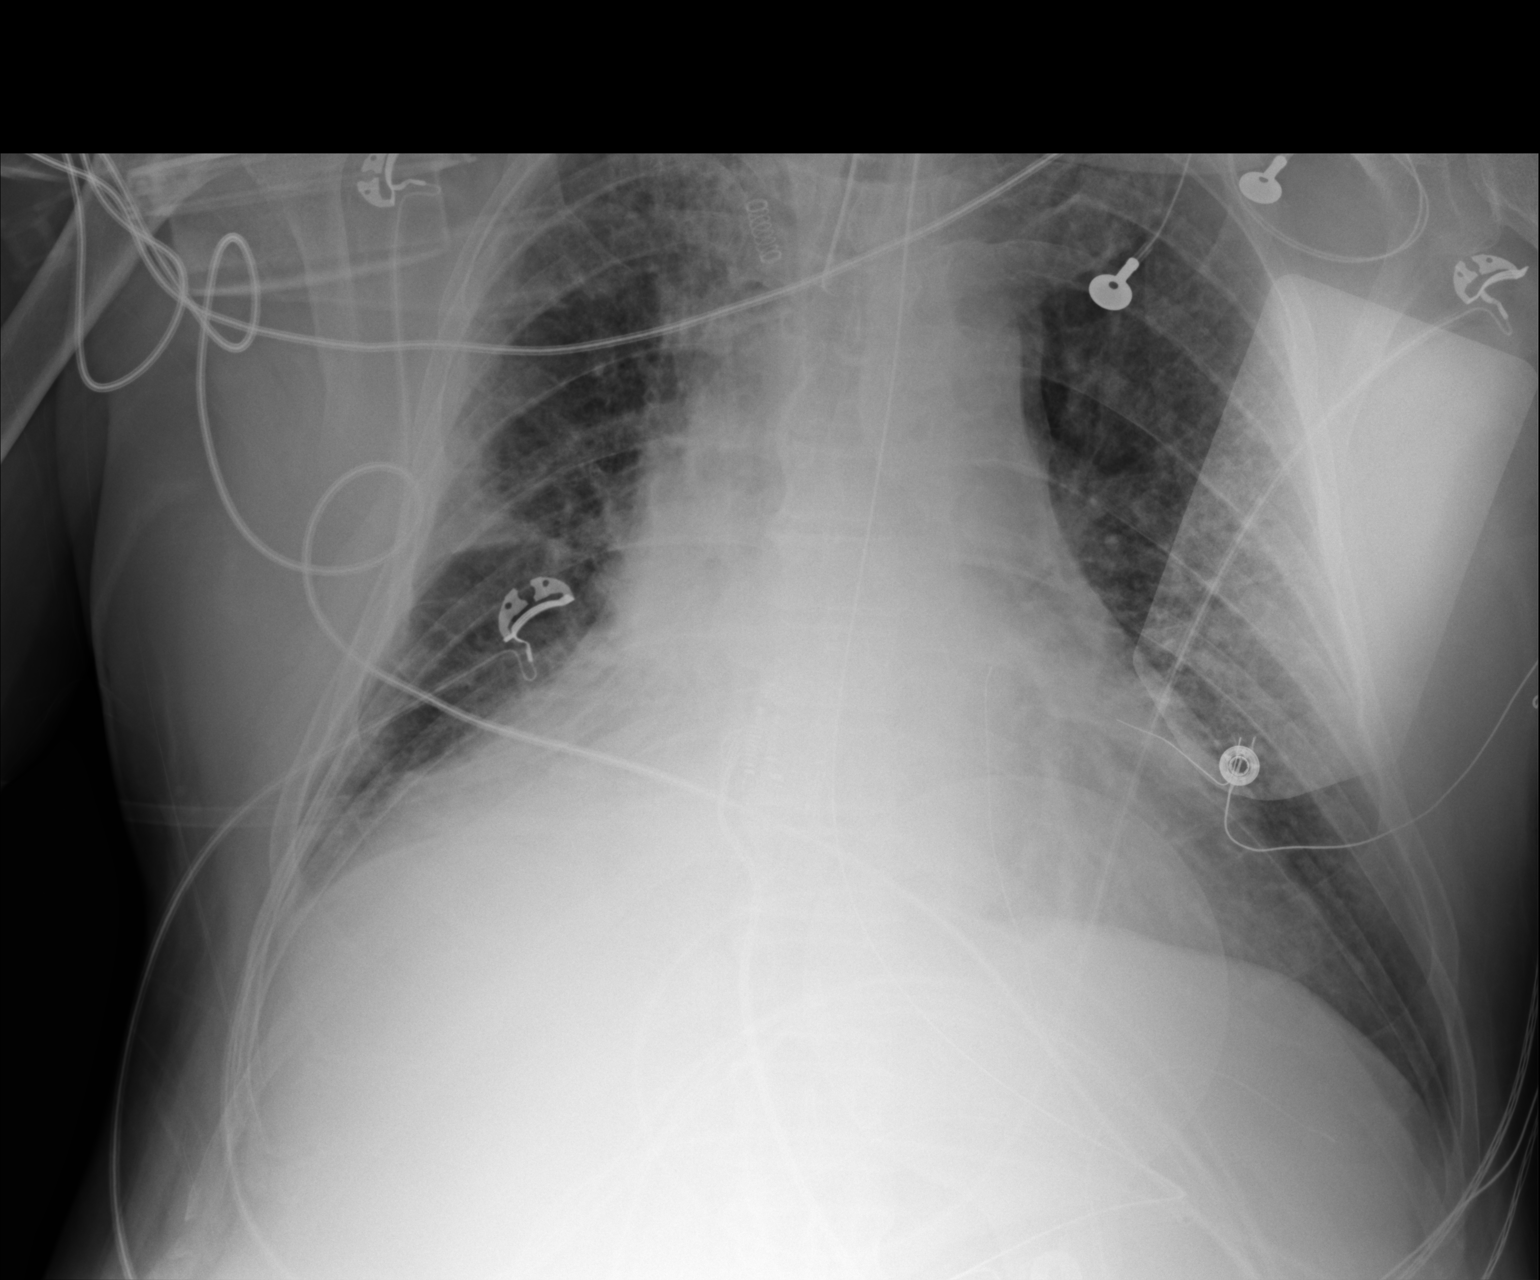

[1 of 1 positions shown; findings below may reference images not displayed]

FINDINGS: The lungs are adequately inflated. The interstitial markings remain
increased. The cardiac silhouette remains enlarged. The pulmonary
vascularity is slightly less prominent today. There is no
pneumothorax nor significant pleural effusion.

The endotracheal tube tip lies 4.4 cm above the carina. External
pacemaker defibrillator pads are present. The esophagogastric tube
tip projects below the inferior margin of the image.
IMPRESSION: Slight interval decrease in pulmonary interstitial edema. Stable
enlargement of cardiac silhouette with central pulmonary vascular
congestion. Stable subsegmental atelectasis at the right lung base.
The support tubes are in reasonable position.
